# Patient Record
Sex: Female | Born: 1955 | Race: Black or African American | Hispanic: No | Marital: Married | Smoking: Never smoker
Health system: Southern US, Community
[De-identification: ages and names within clinical notes are randomized; demographics above are authoritative.]

## PROBLEM LIST (undated history)

## (undated) DIAGNOSIS — M5116 Intervertebral disc disorders with radiculopathy, lumbar region: Secondary | ICD-10-CM

## (undated) DIAGNOSIS — E119 Type 2 diabetes mellitus without complications: Secondary | ICD-10-CM

## (undated) DIAGNOSIS — I2510 Atherosclerotic heart disease of native coronary artery without angina pectoris: Secondary | ICD-10-CM

## (undated) DIAGNOSIS — H35033 Hypertensive retinopathy, bilateral: Secondary | ICD-10-CM

## (undated) DIAGNOSIS — E785 Hyperlipidemia, unspecified: Secondary | ICD-10-CM

## (undated) DIAGNOSIS — E538 Deficiency of other specified B group vitamins: Secondary | ICD-10-CM

## (undated) DIAGNOSIS — M1990 Unspecified osteoarthritis, unspecified site: Secondary | ICD-10-CM

## (undated) DIAGNOSIS — H40053 Ocular hypertension, bilateral: Secondary | ICD-10-CM

## (undated) DIAGNOSIS — R1011 Right upper quadrant pain: Secondary | ICD-10-CM

## (undated) HISTORY — DX: Unspecified osteoarthritis, unspecified site: M19.90

## (undated) HISTORY — DX: Type 2 diabetes mellitus without complications: E11.9

---

## 1999-02-17 ENCOUNTER — Other Ambulatory Visit: Admission: RE | Admit: 1999-02-17 | Discharge: 1999-02-17 | Payer: Self-pay | Admitting: Obstetrics and Gynecology

## 2001-05-05 ENCOUNTER — Other Ambulatory Visit: Admission: RE | Admit: 2001-05-05 | Discharge: 2001-05-05 | Payer: Self-pay | Admitting: Obstetrics and Gynecology

## 2001-05-07 ENCOUNTER — Emergency Department (HOSPITAL_COMMUNITY): Admission: EM | Admit: 2001-05-07 | Discharge: 2001-05-07 | Payer: Self-pay | Admitting: *Deleted

## 2001-05-07 ENCOUNTER — Encounter: Payer: Self-pay | Admitting: *Deleted

## 2003-02-26 ENCOUNTER — Ambulatory Visit (HOSPITAL_BASED_OUTPATIENT_CLINIC_OR_DEPARTMENT_OTHER): Admission: RE | Admit: 2003-02-26 | Discharge: 2003-02-26 | Payer: Self-pay | Admitting: Otolaryngology

## 2015-04-15 ENCOUNTER — Encounter: Payer: Self-pay | Admitting: Family Medicine

## 2015-04-15 ENCOUNTER — Ambulatory Visit (INDEPENDENT_AMBULATORY_CARE_PROVIDER_SITE_OTHER): Payer: BC Managed Care – PPO | Admitting: Family Medicine

## 2015-04-15 ENCOUNTER — Encounter (INDEPENDENT_AMBULATORY_CARE_PROVIDER_SITE_OTHER): Payer: Self-pay

## 2015-04-15 VITALS — BP 110/70 | HR 93 | Temp 98.4°F | Ht 63.86 in | Wt 231.0 lb

## 2015-04-15 DIAGNOSIS — Z800 Family history of malignant neoplasm of digestive organs: Secondary | ICD-10-CM

## 2015-04-15 DIAGNOSIS — E669 Obesity, unspecified: Secondary | ICD-10-CM

## 2015-04-15 DIAGNOSIS — M179 Osteoarthritis of knee, unspecified: Secondary | ICD-10-CM | POA: Diagnosis not present

## 2015-04-15 DIAGNOSIS — M1711 Unilateral primary osteoarthritis, right knee: Secondary | ICD-10-CM

## 2015-04-15 NOTE — Patient Instructions (Signed)
Nice to meet you. Please decrease your soda and tea intake to 2 drinks per week as discussed.  [facility]  We will refer you for a colonoscopy.  Please drop the x-ray of your knee off at the office for me to review.  If you have knee pain, back pain, numbness, weakness, fevers, abdominal pain, blood in your stool, or worsening knee pain please seek medical attention.

## 2015-04-15 NOTE — Progress Notes (Signed)
Pre visit review using our clinic review tool, if applicable. No additional management support is needed unless otherwise documented below in the visit note. 

## 2015-04-16 ENCOUNTER — Encounter: Payer: Self-pay | Admitting: Family Medicine

## 2015-04-16 DIAGNOSIS — E669 Obesity, unspecified: Principal | ICD-10-CM

## 2015-04-16 DIAGNOSIS — M1711 Unilateral primary osteoarthritis, right knee: Secondary | ICD-10-CM | POA: Insufficient documentation

## 2015-04-16 DIAGNOSIS — Z800 Family history of malignant neoplasm of digestive organs: Secondary | ICD-10-CM | POA: Insufficient documentation

## 2015-04-16 NOTE — Assessment & Plan Note (Signed)
Discussed diet and exercise. Patient with good exercise regimen. Poor diet. Patient states she will attempt to lose 8 lbs in the next month prior to next visit. She will do this by decreasing soda intake to 2 sodas/week. She will exercise 4 days a week. She will incorporate more vegetables with her meals. F/u in one month.

## 2015-04-16 NOTE — Assessment & Plan Note (Signed)
History of this. Knee intermittently bothers her. She had recent XRs and will bring the CD by the office. Neurovascularly intact. No abnormalities on exam. Discussed that weight loss would be the most benefit. Sounds like she also had sciatica vs piriformis syndrome. This is improved and patient is asymptomatic at this time. No red flags. Will continue to monitor. Will continue naproxen prn. Given return precautions.

## 2015-04-16 NOTE — Progress Notes (Signed)
Patient ID: Margaret Stark, female   DOB: [birth year], 59 y.o.   MRN: [id number]  [name], MD Phone: [phone]  Margaret Stark is a 59 y.o. female who presents today for new patient visit.   Obesity: patient comes in today for discussion of weight loss. Notes she has a friend that has been on phentermine and looks great from taking this. She goes to water aerobics 3x/wk. Walks some with this. Diet consists of lots of sodas and sweet tea. She stress eats when her job becomes stressful. Eats a fair number of cookies and gelato on the weekends. Typically has eggs and bacon for breakfast, lean cuisine at lunch, and salad with lots of dressing for dinner. Does not eat many veggies or fruits. Snacks during the afternoon from the vending machine. Notes she typically does better with accountability and has lost 40 lbs when trying in the past.   Right knee OA: notes long history of right knee pain. Notes she has recently been evaluated for this and that she had some spasms in her right buttock radiating down her right posterior leg and right foot numbness. She has no back pain. No popping, locking or catching in her knee. Knee only hurts when she walks. Notes she has been taking flexeril and naproxen for her buttock spasm and has had no recurrent issues with this. She denies numbness and weakness at this time.   Family history of colon cancer: sister with colon cancer at age 60. Patient reports prior colonoscopies have been normal. Has not had abdominal pain or blood in stool. No colonoscopy in >10 years. Is due for colonoscopy.   Active Ambulatory Problems    Diagnosis Date Noted  . Obesity 04/16/2015  . Osteoarthritis of right knee 04/16/2015  . Family history of colon cancer 04/16/2015   Resolved Ambulatory Problems    Diagnosis Date Noted  . No Resolved Ambulatory Problems   Past Medical History  Diagnosis Date  . Arthritis     Family History  Problem Relation Age of Onset  .  Hypertension Mother   . Hypertension Father   . Cancer Sister     Social History   Social History  . Marital Status: Married    Spouse Name: N/A  . Number of Children: N/A  . Years of Education: N/A   Occupational History  . Not on file.   Social History Main Topics  . Smoking status: Never Smoker   . Smokeless tobacco: Not on file  . Alcohol Use: 0.0 oz/week    0 Standard drinks or equivalent per week  . Drug Use: No  . Sexual Activity: Not on file   Other Topics Concern  . Not on file   Social History Narrative  . No narrative on file    ROS   General:  Negative for nexplained weight loss, fever Skin: Negative for new or changing mole, sore that won't heal HEENT: Negative for trouble hearing, trouble seeing, ringing in ears, mouth sores, hoarseness, change in voice, dysphagia. CV:  Negative for chest pain, dyspnea, edema, palpitations Resp: Negative for cough, dyspnea, hemoptysis GI: Negative for nausea, vomiting, diarrhea, constipation, abdominal pain, melena, hematochezia. GU: Negative for dysuria, incontinence, urinary hesitance, hematuria, vaginal or penile discharge, polyuria, sexual difficulty, lumps in testicle or breasts MSK: Positive for joint pain, Negative for muscle cramps or aches, joint swelling Neuro: Negative for headaches, weakness, numbness, dizziness, passing out/fainting Psych: Negative for depression, anxiety, memory problems  Objective  Physical Exam Filed  Vitals:   04/15/15 1611  BP: 110/70  Pulse: 93  Temp: 98.4 F (36.9 C)    BP Readings from Last 3 Encounters:  04/15/15 110/70   Wt Readings from Last 3 Encounters:  04/15/15 231 lb (104.781 kg)    Physical Exam  Constitutional: She is well-developed, well-nourished, and in no distress.  HENT:  Head: Normocephalic and atraumatic.  Right Ear: External ear normal.  Left Ear: External ear normal.  Mouth/Throat: Oropharynx is clear and moist. No oropharyngeal exudate.  Eyes:  Conjunctivae are normal. Pupils are equal, round, and reactive to light.  Neck: Neck supple.  Cardiovascular: Normal rate, regular rhythm and normal heart sounds.  Exam reveals no gallop and no friction rub.   No murmur heard. Pulmonary/Chest: Effort normal and breath sounds normal. No respiratory distress. She has no wheezes. She has no rales.  Abdominal: Soft. Bowel sounds are normal. She exhibits no distension. There is no tenderness. There is no rebound and no guarding.  Musculoskeletal:  Bilateral knees with no swelling, erythema, tenderness, ligamentous laxity, and has negative mcmurray, feet WWP No back or buttock tenderness, no midline spine tenderness, negative straight leg raise  Lymphadenopathy:    She has no cervical adenopathy.  Neurological: She is alert.  5/5 strength in bilateral quads, hamstrings, plantar and dorsiflexion, sensation to light touch intact in bilateral LE, normal gait, 2+ patellar reflexes  Skin: Skin is warm and dry. She is not diaphoretic.  Psychiatric: Mood and affect normal.     Assessment/Plan:   Obesity Discussed diet and exercise. Patient with good exercise regimen. Poor diet. Patient states she will attempt to lose 8 lbs in the next month prior to next visit. She will do this by decreasing soda intake to 2 sodas/week. She will exercise 4 days a week. She will incorporate more vegetables with her meals. F/u in one month.   Osteoarthritis of right knee History of this. Knee intermittently bothers her. She had recent XRs and will bring the CD by the office. Neurovascularly intact. No abnormalities on exam. Discussed that weight loss would be the most benefit. Sounds like she also had sciatica vs piriformis syndrome. This is improved and patient is asymptomatic at this time. No red flags. Will continue to monitor. Will continue naproxen prn. Given return precautions.   Family history of colon cancer Sister with colon cancer at age 46. Has not had  colonoscopy in >10 years. No abdominal pain or blood in stool. Will refer to GI for further evaluation.     [name]

## 2015-04-16 NOTE — Assessment & Plan Note (Signed)
Sister with colon cancer at age 58. Has not had colonoscopy in >10 years. No abdominal pain or blood in stool. Will refer to GI for further evaluation.

## 2015-05-06 ENCOUNTER — Telehealth: Payer: Self-pay | Admitting: Family Medicine

## 2015-05-06 NOTE — Telephone Encounter (Signed)
Patient dropped off disc . Disc in [facility] box.

## 2015-05-06 NOTE — Telephone Encounter (Signed)
Please advise 

## 2015-05-15 ENCOUNTER — Ambulatory Visit (INDEPENDENT_AMBULATORY_CARE_PROVIDER_SITE_OTHER): Payer: BC Managed Care – PPO | Admitting: Family Medicine

## 2015-05-15 ENCOUNTER — Other Ambulatory Visit: Payer: Self-pay | Admitting: Family Medicine

## 2015-05-15 ENCOUNTER — Encounter: Payer: Self-pay | Admitting: Family Medicine

## 2015-05-15 VITALS — BP 118/68 | HR 90 | Temp 98.8°F | Ht 63.86 in | Wt 233.6 lb

## 2015-05-15 DIAGNOSIS — M1711 Unilateral primary osteoarthritis, right knee: Secondary | ICD-10-CM

## 2015-05-15 DIAGNOSIS — E669 Obesity, unspecified: Secondary | ICD-10-CM

## 2015-05-15 DIAGNOSIS — F439 Reaction to severe stress, unspecified: Secondary | ICD-10-CM

## 2015-05-15 DIAGNOSIS — Z638 Other specified problems related to primary support group: Secondary | ICD-10-CM | POA: Diagnosis not present

## 2015-05-15 DIAGNOSIS — M179 Osteoarthritis of knee, unspecified: Secondary | ICD-10-CM | POA: Diagnosis not present

## 2015-05-15 DIAGNOSIS — Z1211 Encounter for screening for malignant neoplasm of colon: Secondary | ICD-10-CM

## 2015-05-15 NOTE — Progress Notes (Signed)
Pre visit review using our clinic review tool, if applicable. No additional management support is needed unless otherwise documented below in the visit note. 

## 2015-05-15 NOTE — Progress Notes (Signed)
Patient ID: Margaret Stark, female   DOB: January 13, 1956, 59 y.o.   MRN: [id number]  Margaret Rumps, MD Phone: [phone]  Margaret Stark is a 59 y.o. female who presents today for f/u.  Obesity: patient presents for follow-up of weight loss. She notes she has not lost any weight and in fact gained several pounds. She notes she has continued to drink soda and eat unhealthy foods as it is her way of dealing with stress and depression. She notes she has continued to do water aerobics and enjoys this, though has been unable to cut down on soda or junk food.  When asked about the stressors, she notes she has had recent stressors at work and at home. She denies depression, though does feel down on her self related to these stressors. She denies SI. When asked if she has had thoughts of harming anyone else, she notes no thoughts of physically harming or killing anyone. She then noted there is a Chief Strategy Officer that is working on her house that "better watch his back if he doesn't finish my house soon." When asked what this meant, she stated the same phrase again. I asked if this meant physical harm or homicide and she stated that she would not physically harm or kill any one and would not physically harm or kill the contractor, then she stated that she was planning on getting [employer] to look in to the contractor or sending a letter to the news paper if he did not finish the work on her house soon. She notes the work was started in July and should be done by now. She notes her husband paid the contractor in full already and that she tried to let her husband manage the household, though this has not worked to this point and that this has caused stress in her marriage and caused her to tell her husband that she was leaving him, though they are now working through there issues. She notes she is going to give the contractor 2 weeks to finish the work, then she is going to speak to him regarding this to make sure this  work gets completed quickly. She notes she will ask him to refund $500 a week until the work is done if the house is not finished in the next 2 weeks. She denies any intent or plan to harm the contractor in a physical manner. She denies thoughts of homicidal ideation. She noted on several occasions during the encounter that she would not harm anyone. She notes she has previously seen a counselor, though has not seen them in some time. She notes she is not interested medication for this stress. She is not interested in seeing the counselor at this time.   Right knee pain: patient notes this is stable. CD with XR of right knee reviewed. Patient appears to have arthritic changes. Pain is well controlled with OTC NSAIDs. No pain at this time. Patient wants to know if seeing [facility] would be beneficial.   PMH: nonsmoker.   ROS see HPI  Objective  Physical Exam Filed Vitals:   05/15/15 1558  BP: 118/68  Pulse: 90  Temp: 98.8 F (37.1 C)   Physical Exam  Constitutional: She is well-developed, well-nourished, and in no distress.  HENT:  Head: Normocephalic and atraumatic.  Cardiovascular: Normal rate, regular rhythm and normal heart sounds.  Exam reveals no gallop and no friction rub.   No murmur heard. Pulmonary/Chest: Effort normal and breath sounds normal. No respiratory distress.  She has no wheezes. She has no rales.  Musculoskeletal:  Bilateral knees with no swelling, erythema, tenderness, ligamentous laxity, and has negative mcmurray, feet WWP  Neurological: She is alert. Gait normal.  Skin: Skin is warm and dry. She is not diaphoretic.  Psychiatric:  Normal affect and normal mood     Assessment/Plan: Please see individual problem list.  Obesity Has not made any progress. Symptoms be mostly related to recent stressors in life. She is going to make an effort to cut sodas out and eat healthier. She is going to continue to do water aerobics. We will continue to monitor this and  see her back in one month.  Osteoarthritis of right knee Patient with continued intermittent right knee discomfort. She has OA based on x-ray review. Advised that she can continue over-the-counter NSAIDs for this. She could consider steroid injection in the future. Briefly looked up [facility] online and noted that this is a group of doctors that appears to do viscous supplementation and other proprietary in office procedures to the knee. It appears that the closest office is in [place] and that the physician that works there was previously with an orthopedic group. Advised patient that they appear to be MDs per review of their website and if she wanted to look into doing this she could do this on her own.  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 She has no thoughts of harming herself. She has no thoughts of physically harming or killing anybody. She has no intent or plan to harm anyone. She does note the main stressor is a Chief Strategy Officer at her home and that she is going to get a news team to look into the contractor if he is unable to finish the work on her house. I offered support to the patient. Discussed counseling and/or medication for this. She declined both of these. I advised her that if she were to develop thoughts of physically harming or killing anyone or if she developed intent or a plan to harm anyone or develop thoughts of harming herself she needed to seek medical attention immediately. She was given return precautions.    Margaret Stark

## 2015-05-15 NOTE — Patient Instructions (Addendum)
Nice to see you. Please continue to work on the diet changes we discussed at the last visit.  Please continue to exercise by doing water aerobics.  If you would like help with your stress please let us know.  If you feel that you are going to harm yourself or anyone else please seek medical attention immediately.

## 2015-05-15 NOTE — Telephone Encounter (Signed)
Disc reviewed. Will discuss with patient this afternoon during her office visit.

## 2015-05-16 DIAGNOSIS — F439 Reaction to severe stress, unspecified: Secondary | ICD-10-CM | POA: Insufficient documentation

## 2015-05-16 NOTE — Assessment & Plan Note (Addendum)
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 She has no thoughts of harming herself. She has no thoughts of physically harming or killing anybody. She has no intent or plan to harm anyone. She does note the main stressor is a Chief Strategy Officer at her home and that she is going to get a news team to look into the contractor if he is unable to finish the work on her house. I offered support to the patient. Discussed counseling and/or medication for this. She declined both of these. I advised her that if she were to develop thoughts of physically harming or killing anyone or if she developed intent or a plan to harm anyone or develop thoughts of harming herself she needed to seek medical attention immediately. She was given return precautions.

## 2015-05-16 NOTE — Assessment & Plan Note (Addendum)
Patient with continued intermittent right knee discomfort. She has OA based on x-ray review. Advised that she can continue over-the-counter NSAIDs for this. She could consider steroid injection in the future. Briefly looked up [facility] online and noted that this is a group of doctors that appears to do viscous supplementation and other proprietary in office procedures to the knee. It appears that the closest office is in [place] and that the physician that works there was previously with an orthopedic group. Advised patient that they appear to be MDs per review of their website and if she wanted to look into doing this she could do this on her own.

## 2015-05-16 NOTE — Assessment & Plan Note (Signed)
Has not made any progress. Symptoms be mostly related to recent stressors in life. She is going to make an effort to cut sodas out and eat healthier. She is going to continue to do water aerobics. We will continue to monitor this and see her back in one month.

## 2015-05-20 ENCOUNTER — Encounter (INDEPENDENT_AMBULATORY_CARE_PROVIDER_SITE_OTHER): Payer: Self-pay

## 2015-06-19 ENCOUNTER — Encounter: Payer: Self-pay | Admitting: Family Medicine

## 2015-06-19 ENCOUNTER — Ambulatory Visit (INDEPENDENT_AMBULATORY_CARE_PROVIDER_SITE_OTHER): Payer: BC Managed Care – PPO | Admitting: Family Medicine

## 2015-06-19 ENCOUNTER — Ambulatory Visit: Payer: BC Managed Care – PPO | Admitting: Family Medicine

## 2015-06-19 VITALS — BP 116/72 | HR 81 | Temp 98.1°F | Ht 63.86 in | Wt 237.0 lb

## 2015-06-19 DIAGNOSIS — M1711 Unilateral primary osteoarthritis, right knee: Secondary | ICD-10-CM

## 2015-06-19 DIAGNOSIS — M179 Osteoarthritis of knee, unspecified: Secondary | ICD-10-CM

## 2015-06-19 DIAGNOSIS — Z23 Encounter for immunization: Secondary | ICD-10-CM

## 2015-06-19 DIAGNOSIS — E669 Obesity, unspecified: Secondary | ICD-10-CM

## 2015-06-19 DIAGNOSIS — M7918 Myalgia, other site: Secondary | ICD-10-CM | POA: Insufficient documentation

## 2015-06-19 DIAGNOSIS — F439 Reaction to severe stress, unspecified: Secondary | ICD-10-CM

## 2015-06-19 DIAGNOSIS — M791 Myalgia: Secondary | ICD-10-CM | POA: Diagnosis not present

## 2015-06-19 DIAGNOSIS — Z638 Other specified problems related to primary support group: Secondary | ICD-10-CM

## 2015-06-19 MED ORDER — CYCLOBENZAPRINE HCL 10 MG PO TABS: 10.0000 mg | ORAL_TABLET | Freq: Three times a day (TID) | ORAL | Status: DC | PRN

## 2015-06-19 NOTE — Assessment & Plan Note (Signed)
Right buttock with intermittent cramping sensation radiating down the back of her leg. She has a benign exam today in her back and buttock. Negative straight leg raise. She's neurologically intact. Discussed that this could be coming from her back or muscle spasm in her buttock. We will treat with Flexeril to see if this is beneficial. She can continue as needed naproxen. She is given return precautions.

## 2015-06-19 NOTE — Assessment & Plan Note (Signed)
Injection per below. Given return precautions.

## 2015-06-19 NOTE — Progress Notes (Signed)
Pre visit review using our clinic review tool, if applicable. No additional management support is needed unless otherwise documented below in the visit note. 

## 2015-06-19 NOTE — Assessment & Plan Note (Signed)
Needs to work on diet and exercise. Reinforced this.  losing weight will help with her knee pain.

## 2015-06-19 NOTE — Assessment & Plan Note (Signed)
Stress is improved at home, though still with some stress at work. She denies depression and anxiety. No thoughts of harming herself or others. She would like to continue to monitor this. She'll let us know if this worsens. She's given return precautions.

## 2015-06-19 NOTE — Patient Instructions (Signed)
Nice to see you.  We did an injection in her right knee today. If this becomes  Swollen, red, warm, or you develop fevers please seek medical attention immediately. Continue to work on diet and exercise.  Monitor your stress levels and if this worsens please follow-up.

## 2015-06-19 NOTE — Progress Notes (Signed)
Patient ID: Margaret Stark, female   DOB: 06-14-1956, 59 y.o.   MRN: [id number]  [name], MD Phone: [phone]  [name] is a 59 y.o. female who presents today for  Follow-up.  Right knee osteoarthritis: patient notes continued discomfort in her anterior right knee. She notes it hurts when she goes from seated to standing position. It does occasionally feel like it's going to lock up, though does not. She's not had any knee swelling or erythema. She's not had any fevers. She has had x-rays recently that revealed right knee osteoarthritis. She notes in the past this has responded well to steroid injection and weight loss.  Patient also notes some right buttock spasms that have been going on intermittently for some time. She notes this radiates down the back of her leg. It is a slow radiation. She has no back pain with this. She has no leg swelling with this. She has no numbness or weakness in her legs. She denies saddle anesthesia, bowel and bladder incontinence, fevers, and history of cancer.  Patient has not made much progress with diet. Does not change from last visit. She's not been exercising either. She notes she is still stressed out at work, though she let the stressors at home go and is not worried about them anymore. She notes her house is almost finished. She is not depressed or anxious. She denies SI and HI.  PMH: nonsmoker.   ROS see history of present illness  Objective  Physical Exam Filed Vitals:   06/19/15 1017  BP: 116/72  Pulse: 81  Temp: 98.1 F (36.7 C)    BP Readings from Last 3 Encounters:  06/19/15 116/72  05/15/15 118/68  04/15/15 110/70   Wt Readings from Last 3 Encounters:  06/19/15 237 lb (107.502 kg)  05/15/15 233 lb 9.6 oz (105.96 kg)  04/15/15 231 lb (104.781 kg)    Physical Exam  Constitutional: She is well-developed, well-nourished, and in no distress.  HENT:  Head: Normocephalic and atraumatic.  Cardiovascular: Normal  rate, regular rhythm and normal heart sounds.  Exam reveals no gallop and no friction rub.   No murmur heard. Pulmonary/Chest: Effort normal and breath sounds normal. No respiratory distress. She has no wheezes. She has no rales.  Musculoskeletal:   Right knee with no tenderness palpation, effusion, erythema, or warmth, there is no joint line tenderness, there is no tenderness of the patellar tendon, there is discomfort on extension note the knee , negative McMurray's, no ligamentous laxity   left knee with no tenderness palpation, effusion, erythema, or warmth there is no joint line tenderness, there is no tenderness in the patellar region , there is no ligamentous laxity, negative McMurray's No midline spine tenderness, no muscular back tenderness, no buttocks tenderness, no swelling in these areas, negative straight leg raise  Neurological: She is alert.  5 out of 5 strength in bilateral quads, hamstrings, plantar flexion, and dorsiflexion, sensation to light touch intact in bilateral lower extremities, 2+ patellar reflexes  Skin: Skin is warm and dry. She is not diaphoretic.     Assessment/Plan: Please see individual problem list.  Osteoarthritis of right knee Injection per below. Given return precautions.  Obesity Needs to work on diet and exercise. Reinforced this.  losing weight will help with her knee pain.  Stress at home Stress is improved at home, though still with some stress at work. She denies depression and anxiety. No thoughts of harming herself or others. She would like to continue  to monitor this. She'll let us know if this worsens. She's given return precautions.  Buttock pain Right buttock with intermittent cramping sensation radiating down the back of her leg. She has a benign exam today in her back and buttock. Negative straight leg raise. She's neurologically intact. Discussed that this could be coming from her back or muscle spasm in her buttock. We will treat with  Flexeril to see if this is beneficial. She can continue as needed naproxen. She is given return precautions.    Orders Placed This Encounter  Procedures  . Flu Vaccine QUAD 36+ mos IM    Meds ordered this encounter  Medications  . cyclobenzaprine (FLEXERIL) 10 MG tablet    Sig: Take 1 tablet (10 mg total) by mouth 3 (three) times daily as needed for muscle spasms.    Dispense:  20 tablet    Refill:  0    Knee Injection Procedure Note  Pre-operative Diagnosis: right knee osteoarthritis  Post-operative Diagnosis: same  Indications: Symptom relief from osteoarthritis  Anesthesia:   Skin freeze spray  Procedure Details    written consent was obtained for the procedure. The joint was prepped with Betadine and skin freeze spray was used to anesthetize the skin. A 25 gauge needle was inserted into the medial aspect of the joint line. 3 ml 1% lidocaine and 1 ml of  Depo-Medrol 40 mg per 1 mL was then injected into the joint. The needle was removed and the area cleansed and dressed.  Complications:  None; patient tolerated the procedure well.  [name]

## 2015-06-24 ENCOUNTER — Other Ambulatory Visit: Payer: Self-pay | Admitting: Family Medicine

## 2015-06-24 DIAGNOSIS — Z13220 Encounter for screening for lipoid disorders: Secondary | ICD-10-CM

## 2015-06-24 DIAGNOSIS — E669 Obesity, unspecified: Secondary | ICD-10-CM

## 2015-07-15 ENCOUNTER — Encounter
Admission: RE | Disposition: A | Discharge status: Home / Self Care | Payer: Self-pay | Source: Ambulatory Visit | Attending: Gastroenterology

## 2015-07-15 ENCOUNTER — Encounter: Payer: Self-pay | Admitting: *Deleted

## 2015-07-15 ENCOUNTER — Ambulatory Visit
Admission: RE | Admit: 2015-07-15 | Discharge: 2015-07-15 | Disposition: A | Discharge status: Home / Self Care | Payer: BC Managed Care – PPO | Source: Ambulatory Visit | Attending: Gastroenterology | Admitting: Gastroenterology

## 2015-07-15 ENCOUNTER — Ambulatory Visit: Payer: BC Managed Care – PPO | Admitting: Anesthesiology

## 2015-07-15 ENCOUNTER — Encounter: Payer: Self-pay | Admitting: Family Medicine

## 2015-07-15 ENCOUNTER — Ambulatory Visit (INDEPENDENT_AMBULATORY_CARE_PROVIDER_SITE_OTHER): Payer: BC Managed Care – PPO | Admitting: Family Medicine

## 2015-07-15 DIAGNOSIS — M179 Osteoarthritis of knee, unspecified: Secondary | ICD-10-CM | POA: Insufficient documentation

## 2015-07-15 DIAGNOSIS — Z6841 Body Mass Index (BMI) 40.0 and over, adult: Secondary | ICD-10-CM | POA: Insufficient documentation

## 2015-07-15 DIAGNOSIS — Z8249 Family history of ischemic heart disease and other diseases of the circulatory system: Secondary | ICD-10-CM | POA: Insufficient documentation

## 2015-07-15 DIAGNOSIS — R072 Precordial pain: Secondary | ICD-10-CM

## 2015-07-15 DIAGNOSIS — Z1211 Encounter for screening for malignant neoplasm of colon: Secondary | ICD-10-CM | POA: Diagnosis present

## 2015-07-15 DIAGNOSIS — Z800 Family history of malignant neoplasm of digestive organs: Secondary | ICD-10-CM | POA: Diagnosis not present

## 2015-07-15 DIAGNOSIS — D122 Benign neoplasm of ascending colon: Secondary | ICD-10-CM | POA: Insufficient documentation

## 2015-07-15 DIAGNOSIS — M1990 Unspecified osteoarthritis, unspecified site: Secondary | ICD-10-CM | POA: Diagnosis not present

## 2015-07-15 HISTORY — PX: COLONOSCOPY WITH PROPOFOL: SHX5780

## 2015-07-15 SURGERY — COLONOSCOPY WITH PROPOFOL
Anesthesia: General

## 2015-07-15 MED ORDER — SODIUM CHLORIDE 0.9 % IV SOLN: INTRAVENOUS | Status: DC

## 2015-07-15 MED ORDER — SODIUM CHLORIDE 0.9 % IV SOLN
INTRAVENOUS | Status: DC
  Administered 2015-07-15: 1000 mL via INTRAVENOUS

## 2015-07-15 MED ORDER — PROPOFOL 10 MG/ML IV BOLUS
INTRAVENOUS | Status: DC | PRN
  Administered 2015-07-15: 20 mg via INTRAVENOUS
  Administered 2015-07-15: 80 mg via INTRAVENOUS

## 2015-07-15 NOTE — H&P (Signed)
    Primary Care Physician:  [name], MD Primary Gastroenterologist:  Dr. Candace Cruise  Pre-Procedure History & Physical: HPI:  Margaret Stark is a 59 y.o. female is here for an colonoscopy.   Past Medical History  Diagnosis Date  . Arthritis     History reviewed. No pertinent past surgical history.  Prior to Admission medications   Medication Sig Start Date End Date Taking? Authorizing Provider  cyclobenzaprine (FLEXERIL) 10 MG tablet Take 1 tablet (10 mg total) by mouth 3 (three) times daily as needed for muscle spasms. 06/19/15   Leone Haven, MD  LUMIGAN 0.01 % SOLN INSTILL 1 DROP IN [facility] EYE AT BEDTIME 03/03/15   Historical Provider, MD  naproxen (NAPROSYN) 500 MG tablet Take 500 mg by mouth 2 (two) times daily with a meal. 03/08/15   Historical Provider, MD    Allergies as of 07/11/2015  . (No Known Allergies)    Family History  Problem Relation Age of Onset  . Hypertension Mother   . Hypertension Father   . Cancer Sister     colon - age 61    Social History   Social History  . Marital Status: Married    Spouse Name: N/A  . Number of Children: N/A  . Years of Education: N/A   Occupational History  . Not on file.   Social History Main Topics  . Smoking status: Never Smoker   . Smokeless tobacco: Not on file  . Alcohol Use: 0.0 oz/week    0 Standard drinks or equivalent per week  . Drug Use: No  . Sexual Activity: Not on file   Other Topics Concern  . Not on file   Social History Narrative    Review of Systems: See HPI, otherwise negative ROS  Physical Exam: BP 122/79 mmHg  Pulse 89  Temp(Src) 97.2 F (36.2 C) (Tympanic)  Resp 20  Ht 5\' 4"  (1.626 m)  Wt 106.595 kg (235 lb)  BMI 40.32 kg/m2  SpO2 100% General:   Alert,  pleasant and cooperative in NAD Head:  Normocephalic and atraumatic. Neck:  Supple; no masses or thyromegaly. Lungs:  Clear throughout to auscultation.    Heart:  Regular rate and rhythm. Abdomen:  Soft, nontender and  nondistended. Normal bowel sounds, without guarding, and without rebound.   Neurologic:  Alert and  oriented x4;  grossly normal neurologically.  Impression/Plan: Margaret Stark is here for an colonoscopy to be performed for screening, family hx of colon cancer Risks, benefits, limitations, and alternatives regarding  colonoscopy have been reviewed with the patient.  Questions have been answered.  All parties agreeable.   Shonika Kolasinski, Lupita Dawn, MD  07/15/2015, 2:59 PM

## 2015-07-15 NOTE — Anesthesia Preprocedure Evaluation (Signed)
Anesthesia Evaluation  Patient identified by MRN, date of birth, ID band Patient awake    Reviewed: Allergy & Precautions, H&P , NPO status , Patient's Chart, lab work & pertinent test results  History of Anesthesia Complications Negative for: history of anesthetic complications  Airway Mallampati: III  TM Distance: >3 FB Neck ROM: full    Dental no notable dental hx. (+) Teeth Intact   Pulmonary neg shortness of breath, sleep apnea ,    Pulmonary exam normal breath sounds clear to auscultation       Cardiovascular Exercise Tolerance: Good (-) angina(-) Past MI and (-) DOE negative cardio ROS Normal cardiovascular exam Rhythm:regular Rate:Normal     Neuro/Psych negative neurological ROS  negative psych ROS   GI/Hepatic negative GI ROS, Neg liver ROS, neg GERD  ,  Endo/Other  Morbid obesity  Renal/GU negative Renal ROS  negative genitourinary   Musculoskeletal   Abdominal   Peds  Hematology negative hematology ROS (+)   Anesthesia Other Findings Past Medical History:   Arthritis                                                   History reviewed. No pertinent surgical history.  BMI    Body Mass Index   40.31 kg/m 2      Reproductive/Obstetrics negative OB ROS                             Anesthesia Physical Anesthesia Plan  ASA: III  Anesthesia Plan: General   Post-op Pain Management:    Induction:   Airway Management Planned:   Additional Equipment:   Intra-op Plan:   Post-operative Plan:   Informed Consent: I have reviewed the patients History and Physical, chart, labs and discussed the procedure including the risks, benefits and alternatives for the proposed anesthesia with the patient or authorized representative who has indicated his/her understanding and acceptance.   Dental Advisory Given  Plan Discussed with: Anesthesiologist, CRNA and Surgeon  Anesthesia  Plan Comments:         Anesthesia Quick Evaluation

## 2015-07-15 NOTE — Progress Notes (Signed)
Pre visit review using our clinic review tool, if applicable. No additional management support is needed unless otherwise documented below in the visit note. 
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
## 2015-07-15 NOTE — Transfer of Care (Signed)
Immediate Anesthesia Transfer of Care Note  Patient: Margaret Stark  Procedure(s) Performed: Procedure(s): COLONOSCOPY WITH PROPOFOL (N/A)  Patient Location: Endoscopy Unit  Anesthesia Type:General  Level of Consciousness: awake and alert   Airway & Oxygen Therapy: Patient Spontanous Breathing and Patient connected to nasal cannula oxygen  Post-op Assessment: Report given to RN and Post -op Vital signs reviewed and stable  Post vital signs: Reviewed and stable  Last Vitals:  Filed Vitals:   07/15/15 1554 07/15/15 1600  BP: 111/71 111/69  Pulse: 80 82  Temp:    Resp: 14 14    Complications: No apparent anesthesia complications and Patient re-intubated

## 2015-07-15 NOTE — Anesthesia Postprocedure Evaluation (Signed)
Anesthesia Post Note  Patient: Margaret Stark  Procedure(s) Performed: Procedure(s) (LRB): COLONOSCOPY WITH PROPOFOL (N/A)  Patient location during evaluation: Endoscopy Anesthesia Type: General Level of consciousness: awake and alert Pain management: pain level controlled Vital Signs Assessment: post-procedure vital signs reviewed and stable Respiratory status: spontaneous breathing, nonlabored ventilation, respiratory function stable and patient connected to nasal cannula oxygen Cardiovascular status: blood pressure returned to baseline and stable Postop Assessment: no signs of nausea or vomiting Anesthetic complications: no    Last Vitals:  Filed Vitals:   07/15/15 1554 07/15/15 1600  BP: 111/71 111/69  Pulse: 80 82  Temp:    Resp: 14 14    Last Pain: There were no vitals filed for this visit.               Precious Haws Piscitello

## 2015-07-15 NOTE — Op Note (Signed)
[facility] Patient Name: Margaret Stark Procedure Date: 07/15/2015 3:06 PM MRN: [id number] Account #: [id number] Date of Birth: 1956/01/30 Admit Type: Outpatient Age: 59 Room: [facility] ENDO ROOM 4 Gender: Female Note Status: Finalized Procedure:         Colonoscopy Indications:       Screening in patient at increased risk: Family history of                     1st-degree relative with colorectal cancer before age 23                     years Providers:         Lupita Dawn. Candace Cruise, MD Referring MD:      Forest Gleason Md, MD (Referring MD) Medicines:         Monitored Anesthesia Care Complications:     No immediate complications. Procedure:         Pre-Anesthesia Assessment:                    - Prior to the procedure, a History and Physical was                     performed, and patient medications, allergies and                     sensitivities were reviewed. The patient's tolerance of                     previous anesthesia was reviewed.                    - The risks and benefits of the procedure and the sedation                     options and risks were discussed with the patient. All                     questions were answered and informed consent was obtained.                    - After reviewing the risks and benefits, the patient was                     deemed in satisfactory condition to undergo the procedure.                    After obtaining informed consent, the colonoscope was                     passed under direct vision. Throughout the procedure, the                     patient's blood pressure, pulse, and oxygen saturations                     were monitored continuously. The Colonoscope was                     introduced through the anus and advanced to the the cecum,                     identified by appendiceal orifice and ileocecal valve. The  colonoscopy was performed without difficulty. The patient    tolerated the procedure well. The quality of the bowel                     preparation was good. Findings:      A medium polyp was found in the ascending colon. The polyp was sessile.       The polyp was removed with a hot snare. Resection was complete, but the       polyp tissue was not retrieved.      The exam was otherwise without abnormality. Impression:        - One medium polyp in the ascending colon. Complete                     resection. Polyp tissue not retrieved.                    - The examination was otherwise normal. Recommendation:    - Discharge patient to home.                    - Repeat colonoscopy in 3 years for surveillance.                    - The findings and recommendations were discussed with the                     patient. Procedure Code(s): --- Professional ---                    [id number], Colonoscopy, flexible; with removal of tumor(s),                     polyp(s), or other lesion(s) by snare technique Diagnosis Code(s): --- Professional ---                    Z80.0, Family history of malignant neoplasm of digestive                     organs                    D12.2, Benign neoplasm of ascending colon CPT copyright 2014 American Medical Association. All rights reserved. The codes documented in this report are preliminary and upon coder review may  be revised to meet current compliance requirements. Hulen Luster, MD 07/15/2015 3:31:57 PM This report has been signed electronically. Number of Addenda: 0 Note Initiated On: 07/15/2015 3:06 PM Scope Withdrawal Time: 0 hours 12 minutes 53 seconds  Total Procedure Duration: 0 hours 16 minutes 47 seconds       [facility]
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
## 2015-07-15 NOTE — Anesthesia Postprocedure Evaluation (Deleted)
Anesthesia Post Note  Patient: Margaret Stark  Procedure(s) Performed: Procedure(s) (LRB): COLONOSCOPY WITH PROPOFOL (N/A)  Patient location during evaluation: Endoscopy Anesthesia Type: General Level of consciousness: awake and alert Pain management: pain level controlled Vital Signs Assessment: post-procedure vital signs reviewed and stable Respiratory status: spontaneous breathing, nonlabored ventilation, respiratory function stable and patient connected to nasal cannula oxygen Cardiovascular status: blood pressure returned to baseline and stable Postop Assessment: no signs of nausea or vomiting Anesthetic complications: no    Last Vitals:  Filed Vitals:   07/15/15 1554 07/15/15 1600  BP: 111/71 111/69  Pulse: 80 82  Temp:    Resp: 14 14    Last Pain: There were no vitals filed for this visit.               Precious Haws Piscitello
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
## 2015-07-18 ENCOUNTER — Encounter: Payer: Self-pay | Admitting: Gastroenterology

## 2016-01-23 ENCOUNTER — Encounter: Payer: Self-pay | Admitting: Family Medicine

## 2016-01-23 ENCOUNTER — Ambulatory Visit (INDEPENDENT_AMBULATORY_CARE_PROVIDER_SITE_OTHER): Payer: BC Managed Care – PPO | Admitting: Family Medicine

## 2016-01-23 VITALS — BP 120/76 | HR 87 | Temp 98.4°F | Ht 63.86 in | Wt 251.8 lb

## 2016-01-23 DIAGNOSIS — M791 Myalgia, unspecified site: Secondary | ICD-10-CM

## 2016-01-23 DIAGNOSIS — M179 Osteoarthritis of knee, unspecified: Secondary | ICD-10-CM | POA: Diagnosis not present

## 2016-01-23 DIAGNOSIS — E669 Obesity, unspecified: Secondary | ICD-10-CM | POA: Diagnosis not present

## 2016-01-23 DIAGNOSIS — M1711 Unilateral primary osteoarthritis, right knee: Secondary | ICD-10-CM

## 2016-01-23 MED ORDER — DICLOFENAC SODIUM 75 MG PO TBEC: 75.0000 mg | DELAYED_RELEASE_TABLET | Freq: Two times a day (BID) | ORAL | Status: DC

## 2016-01-23 NOTE — Assessment & Plan Note (Signed)
Patient with osteoarthritis in her right knee and suspect that is the cause of her left knee pain as well. Suspect arthritis could be causing her leg discomfort though could also be related to her recent increase in physical activity. Doubt other causes of myalgias though will obtain a CK to ensure it is not elevated. Diclofenac for her discomfort. Exercises given to patient. I do not think she would benefit from further injections in her knees with steroids given last one was not beneficial. Discussed next step as physical therapy or referral to sports medicine/orthopedics.

## 2016-01-23 NOTE — Addendum Note (Signed)
Addended by: Frutoso Chase A on: 01/23/2016 02:30 PM   Modules accepted: Orders

## 2016-01-23 NOTE — Assessment & Plan Note (Addendum)
Worsened chronic issue. Refer to nutrition. Advised to continue to exercise. Discussed that if she makes an effort over a several month period of time and does not have a decrease in weight could consider weight loss medications.

## 2016-01-23 NOTE — Progress Notes (Signed)
Patient ID: Margaret Stark, female   DOB: [birth year], 60 y.o.   MRN: [id number]  [name], MD Phone: [phone]  Margaret Stark is a 60 y.o. female who presents today for follow-up.  Knee pain: Patient notes bilateral knees have been hurting her. Ache and then occasional sharp pains in her right knee. Notes her muscles are sore as well. Notes she has been doing water aerobics and the muscles have only been sore since she started that. She is trying not to wear cheap shoes so that she has better support. Aleve has not been helpful. Using icy hot which has not been helpful. She had an injection in her right knee November of last year that was not beneficial.  Obesity: Patient notes she can't get herself to stop eating. She drinks too much soda and needs to be sweets. Breakfast is typically several hours after waking up and consist of a soda and then oatmeal pie. Is exercising by going to water aerobics 4 times a week.  PMH: nonsmoker.   ROS see history of present illness  Objective  Physical Exam Filed Vitals:   01/23/16 1349  BP: 120/76  Pulse: 87  Temp: 98.4 F (36.9 C)    BP Readings from Last 3 Encounters:  01/23/16 120/76  07/15/15 111/69  07/15/15 112/72   Wt Readings from Last 3 Encounters:  01/23/16 251 lb 12.8 oz (114.216 kg)  07/15/15 235 lb (106.595 kg)  07/15/15 235 lb 3.2 oz (106.686 kg)    Physical Exam  Constitutional: No distress.  HENT:  Head: Normocephalic and atraumatic.  Cardiovascular: Normal rate, regular rhythm and normal heart sounds.   Pulmonary/Chest: Effort normal and breath sounds normal.  Musculoskeletal:  Bilateral knees no swelling, bony tenderness, bony lesions, joint line tenderness, or ligamentous laxity, right knee with discomfort on McMurray's though no clicking, left knee negative McMurray's  Neurological: She is alert.  Skin: Skin is warm and dry. She is not diaphoretic.     Assessment/Plan: Please see individual  problem list.  Obesity Worsened chronic issue. Refer to nutrition. Advised to continue to exercise. Discussed that if she makes an effort over a several month period of time and does not have a decrease in weight could consider weight loss medications.  Osteoarthritis of right knee Patient with osteoarthritis in her right knee and suspect that is the cause of her left knee pain as well. Suspect arthritis could be causing her leg discomfort though could also be related to her recent increase in physical activity. Doubt other causes of myalgias though will obtain a CK to ensure it is not elevated. Diclofenac for her discomfort. Exercises given to patient. I do not think she would benefit from further injections in her knees with steroids given last one was not beneficial. Discussed next step as physical therapy or referral to sports medicine/orthopedics.    Orders Placed This Encounter  Procedures  . CK (Creatine Kinase)  . Amb ref to Medical Nutrition Therapy-MNT    Referral Priority:  Routine    Referral Type:  Consultation    Referral Reason:  Specialty Services Required    Requested Specialty:  Nutrition    Number of Visits Requested:  1    Meds ordered this encounter  Medications  . diclofenac (VOLTAREN) 75 MG EC tablet    Sig: Take 1 tablet (75 mg total) by mouth 2 (two) times daily.    Dispense:  30 tablet    Refill:  0   [name],  MD [place]

## 2016-01-23 NOTE — Patient Instructions (Signed)
Nice to see you. Please do the following exercises for her knees. I will refer you to nutrition. You can take diclofenac for your knees as well. Please continue exercising. Please start eating within an hour waking up in the morning. Please try to eat something healthier for breakfast such as eggs or fruit and yogurt.   Generic Knee Exercises EXERCISES RANGE OF MOTION (ROM) AND STRETCHING EXERCISES These exercises may help you when beginning to rehabilitate your injury. Your symptoms may resolve with or without further involvement from your physician, physical therapist, or athletic trainer. While completing these exercises, remember:   Restoring tissue flexibility helps normal motion to return to the joints. This allows healthier, less painful movement and activity.  An effective stretch should be held for at least 30 seconds.  A stretch should never be painful. You should only feel a gentle lengthening or release in the stretched tissue. STRETCH - Knee Extension, Prone  Lie on your stomach on a firm surface, such as a bed or countertop. Place your right / left knee and leg just beyond the edge of the surface. You may wish to place a towel under the far end of your right / left thigh for comfort.  Relax your leg muscles and allow gravity to straighten your knee. Your clinician may advise you to add an ankle weight if more resistance is helpful for you.  You should feel a stretch in the back of your right / left knee. Hold this position for __________ seconds. Repeat __________ times. Complete this stretch __________ times per day. * Your physician, physical therapist, or athletic trainer may ask you to add ankle weight to enhance your stretch.  RANGE OF MOTION - Knee Flexion, Active  Lie on your back with both knees straight. (If this causes back discomfort, bend your opposite knee, placing your foot flat on the floor.)  Slowly slide your heel back toward your buttocks until you feel a  gentle stretch in the front of your knee or thigh.  Hold for __________ seconds. Slowly slide your heel back to the starting position. Repeat __________ times. Complete this exercise __________ times per day.  STRETCH - Quadriceps, Prone   Lie on your stomach on a firm surface, such as a bed or padded floor.  Bend your right / left knee and grasp your ankle. If you are unable to reach your ankle or pant leg, use a belt around your foot to lengthen your reach.  Gently pull your heel toward your buttocks. Your knee should not slide out to the side. You should feel a stretch in the front of your thigh and/or knee.  Hold this position for __________ seconds. Repeat __________ times. Complete this stretch __________ times per day.  STRETCH - Hamstrings, Supine   Lie on your back. Loop a belt or towel over the ball of your right / left foot.  Straighten your right / left knee and slowly pull on the belt to raise your leg. Do not allow the right / left knee to bend. Keep your opposite leg flat on the floor.  Raise the leg until you feel a gentle stretch behind your right / left knee or thigh. Hold this position for __________ seconds. Repeat __________ times. Complete this stretch __________ times per day.  STRENGTHENING EXERCISES These exercises may help you when beginning to rehabilitate your injury. They may resolve your symptoms with or without further involvement from your physician, physical therapist, or athletic trainer. While completing these exercises,  remember:   Muscles can gain both the endurance and the strength needed for everyday activities through controlled exercises.  Complete these exercises as instructed by your physician, physical therapist, or athletic trainer. Progress the resistance and repetitions only as guided.  You may experience muscle soreness or fatigue, but the pain or discomfort you are trying to eliminate should never worsen during these exercises. If this  pain does worsen, stop and make certain you are following the directions exactly. If the pain is still present after adjustments, discontinue the exercise until you can discuss the trouble with your clinician. STRENGTH - Quadriceps, Isometrics  Lie on your back with your right / left leg extended and your opposite knee bent.  Gradually tense the muscles in the front of your right / left thigh. You should see either your knee cap slide up toward your hip or increased dimpling just above the knee. This motion will push the back of the knee down toward the floor/mat/bed on which you are lying.  Hold the muscle as tight as you can without increasing your pain for __________ seconds.  Relax the muscles slowly and completely in between each repetition. Repeat __________ times. Complete this exercise __________ times per day.  STRENGTH - Quadriceps, Short Arcs   Lie on your back. Place a __________ inch towel roll under your knee so that the knee slightly bends.  Raise only your lower leg by tightening the muscles in the front of your thigh. Do not allow your thigh to rise.  Hold this position for __________ seconds. Repeat __________ times. Complete this exercise __________ times per day.  OPTIONAL ANKLE WEIGHTS: Begin with ____________________, but DO NOT exceed ____________________. Increase in 1 pound/0.5 kilogram increments.  STRENGTH - Quadriceps, Straight Leg Raises  Quality counts! Watch for signs that the quadriceps muscle is working to insure you are strengthening the correct muscles and not "cheating" by substituting with healthier muscles.  Lay on your back with your right / left leg extended and your opposite knee bent.  Tense the muscles in the front of your right / left thigh. You should see either your knee cap slide up or increased dimpling just above the knee. Your thigh may even quiver.  Tighten these muscles even more and raise your leg 4 to 6 inches off the floor. Hold for  __________ seconds.  Keeping these muscles tense, lower your leg.  Relax the muscles slowly and completely in between each repetition. Repeat __________ times. Complete this exercise __________ times per day.  STRENGTH - Hamstring, Curls  Lay on your stomach with your legs extended. (If you lay on a bed, your feet may hang over the edge.)  Tighten the muscles in the back of your thigh to bend your right / left knee up to 90 degrees. Keep your hips flat on the bed/floor.  Hold this position for __________ seconds.  Slowly lower your leg back to the starting position. Repeat __________ times. Complete this exercise __________ times per day.  OPTIONAL ANKLE WEIGHTS: Begin with ____________________, but DO NOT exceed ____________________. Increase in 1 pound/0.5 kilogram increments.  STRENGTH - Quadriceps, Squats  Stand in a door frame so that your feet and knees are in line with the frame.  Use your hands for balance, not support, on the frame.  Slowly lower your weight, bending at the hips and knees. Keep your lower legs upright so that they are parallel with the door frame. Squat only within the range that does not increase  your knee pain. Never let your hips drop below your knees.  Slowly return upright, pushing with your legs, not pulling with your hands. Repeat __________ times. Complete this exercise __________ times per day.  STRENGTH - Quadriceps, Wall Slides  Follow guidelines for form closely. Increased knee pain often results from poorly placed feet or knees.  Lean against a smooth wall or door and walk your feet out 18-24 inches. Place your feet hip-width apart.  Slowly slide down the wall or door until your knees bend __________ degrees.* Keep your knees over your heels, not your toes, and in line with your hips, not falling to either side.  Hold for __________ seconds. Stand up to rest for __________ seconds in between each repetition. Repeat __________ times. Complete  this exercise __________ times per day. * Your physician, physical therapist, or athletic trainer will alter this angle based on your symptoms and progress.   This information is not intended to replace advice given to you by your health care provider. Make sure you discuss any questions you have with your health care provider.   Document Released: 05/27/2005 Document Revised: 08/03/2014 Document Reviewed: 10/25/2008 Elsevier Interactive Patient Education [employer].

## 2016-01-23 NOTE — Progress Notes (Signed)
Pre visit review using our clinic review tool, if applicable. No additional management support is needed unless otherwise documented below in the visit note. 

## 2016-01-24 ENCOUNTER — Other Ambulatory Visit (INDEPENDENT_AMBULATORY_CARE_PROVIDER_SITE_OTHER): Payer: BC Managed Care – PPO

## 2016-01-24 DIAGNOSIS — M791 Myalgia, unspecified site: Secondary | ICD-10-CM

## 2016-01-24 DIAGNOSIS — Z13220 Encounter for screening for lipoid disorders: Secondary | ICD-10-CM | POA: Diagnosis not present

## 2016-01-24 DIAGNOSIS — E669 Obesity, unspecified: Secondary | ICD-10-CM

## 2016-01-27 ENCOUNTER — Other Ambulatory Visit (INDEPENDENT_AMBULATORY_CARE_PROVIDER_SITE_OTHER): Payer: BC Managed Care – PPO

## 2016-01-27 DIAGNOSIS — Z13220 Encounter for screening for lipoid disorders: Secondary | ICD-10-CM | POA: Diagnosis not present

## 2016-01-27 LAB — COMPREHENSIVE METABOLIC PANEL
ALT: 10 U/L (ref 0–35)
AST: 13 U/L (ref 0–37)
Albumin: 3.9 g/dL (ref 3.5–5.2)
Alkaline Phosphatase: 74 U/L (ref 39–117)
BILIRUBIN TOTAL: 0.4 mg/dL (ref 0.2–1.2)
BUN: 13 mg/dL (ref 6–23)
CALCIUM: 9.6 mg/dL (ref 8.4–10.5)
CHLORIDE: 108 meq/L (ref 96–112)
CO2: 27 mEq/L (ref 19–32)
CREATININE: 0.9 mg/dL (ref 0.40–1.20)
GFR: 82.26 mL/min (ref >60.00)
GLUCOSE: 116 mg/dL — AB (ref 70–99)
Potassium: 3.8 mEq/L (ref 3.5–5.1)
SODIUM: 137 meq/L (ref 135–145)
Total Protein: 6.8 g/dL (ref 6.0–8.3)

## 2016-01-27 LAB — LIPID PANEL
CHOL/HDL RATIO: 5
Cholesterol: 203 mg/dL — ABNORMAL HIGH (ref 0–200)
HDL: 41.9 mg/dL (ref >39.00)
LDL Cholesterol: 133 mg/dL — ABNORMAL HIGH (ref 0–99)
NONHDL: 161.46
TRIGLYCERIDES: 144 mg/dL (ref 0.0–149.0)
VLDL: 28.8 mg/dL (ref 0.0–40.0)

## 2016-01-27 LAB — CK: Total CK: 102 U/L (ref 7–177)

## 2016-01-27 LAB — HEMOGLOBIN A1C: Hgb A1c MFr Bld: 6.3 % (ref 4.6–6.5)

## 2016-01-27 LAB — TSH: TSH: 1.92 u[IU]/mL (ref 0.35–4.50)

## 2016-01-29 ENCOUNTER — Telehealth: Payer: Self-pay | Admitting: *Deleted

## 2016-01-29 NOTE — Telephone Encounter (Signed)
Pt requested lab results  Pt contact [phone]

## 2016-01-29 NOTE — Telephone Encounter (Signed)
Spoke with patient, labs reviewed. See result note. thanks

## 2016-02-06 ENCOUNTER — Telehealth: Payer: Self-pay | Admitting: *Deleted

## 2016-02-06 MED ORDER — DICLOFENAC SODIUM 75 MG PO TBEC: 75.0000 mg | DELAYED_RELEASE_TABLET | Freq: Two times a day (BID) | ORAL | Status: DC

## 2016-02-06 NOTE — Telephone Encounter (Signed)
Please advise, this was refilled on 6/29, thanks

## 2016-02-06 NOTE — Telephone Encounter (Signed)
Refill sent to pharmacy.   

## 2016-02-06 NOTE — Telephone Encounter (Signed)
Pt requested a prior authorization and refill for diclofenac  Pharmacy [facility]

## 2016-02-14 ENCOUNTER — Encounter: Payer: BC Managed Care – PPO | Attending: Family Medicine | Admitting: Dietician

## 2016-02-14 ENCOUNTER — Encounter: Payer: Self-pay | Admitting: Dietician

## 2016-02-14 VITALS — Ht 64.0 in | Wt 251.2 lb

## 2016-02-14 DIAGNOSIS — E669 Obesity, unspecified: Secondary | ICD-10-CM | POA: Diagnosis not present

## 2016-02-14 NOTE — Patient Instructions (Signed)
   Make healthy food choices: vegetables, fruits, whole grain foods, lean protein foods.   Limit sodas to 3 per week to start, gradually decrease from there. Have an alternative, like flavored waters/ crystal light.   Gradually decrease sweet snacks and include more healthy snack options such as fruit with nuts, vanilla wafers, graham crackers, yogurt or lowfat pudding.  Consider some form of tracking your food intake, and weigh at least once a week.

## 2016-02-14 NOTE — Progress Notes (Signed)
Medical Nutrition Therapy: Visit start time: 1100  end time: 1200  Assessment:  Diagnosis: obesity Past medical history: slightly elevated cholesterol Psychosocial issues/ stress concerns: none; does some stress eating Preferred learning method:  . Visual  Current weight: 251.2lbs  Height: 5'4" Medications, supplements: reviewed list in chart with patient  Progress and evaluation: Patient reports gradual weight gain over years. She has lost some wieght in the past on weight watchers, but did not like the program. Lost more weight when taking phentermine, but regained when she stopped takng.    Wants a long-term plan for weight loss to relieve arthritis pain and feel better and maintain good health.   Physical activity: water aerobics, 45 minutes, 4 times a week  Dietary Intake:  Usual eating pattern includes 2-3 meals and 2 snacks per day. Dining out frequency: 6+ meals per week.  Breakfast: 7:30 when working; varies during summer: 3 slices bacon with boiled or scrambled egg; ham and cheese without biscuit 1/2-sweet tea, potato cake (2xa week). Takes breakfast to work the other days.  Snack: usually none Lunch: salad or pkg crackers and soda, or school cafeteria Snack: sometimes candy bar or snack cake and soda, not regularly Supper: rotisserie chicken, green beans or carrots. Sometimes skips or eats something simple like popcorn.  Snack: sometimes small cup of ice cream, or peanuts Beverages: water, some sodas  Nutrition Care Education: Topics covered: weight management Basic nutrition: basic food groups, appropriate nutrient balance, appropriate meal and snack schedule, general nutrition guidelines    Weight control: determining reasonable weight goal, behavioral changes for weight loss: managing stress eating, habit change, appropriate food portions and strategies for portion control, guidance for 1500kcal daily intake, importance of low fat and low sugar foods, healthy meal and snack  options. Other lifestyle changes:  benefits of regular exercise, as well as regular weighing and tracking food intake.  Nutritional Diagnosis:  [place]-3.3 Overweight/obesity As related to excess calories, history of inactivity.  As evidenced by patient report.  Intervention: Instruction as noted above.   Patient will work on a more structured eating pattern and balanced meals.    Set goals with patient input.  Education Materials given:  . Food lists/ Planning A Balanced Meal . Sample meal pattern/ menus: Quick and Healthy Meal Ideas . Snacking handout . Goals/ instructions  Learner/ who was taught:  . Patient   Level of understanding: Marland Kitchen Verbalizes/ demonstrates competency  Demonstrated degree of understanding via:   Teach back Learning barriers: . None  Willingness to learn/ readiness for change: . Acceptance, ready for change  Monitoring and Evaluation:  Dietary intake, exercise, and body weight      follow up: 03/13/16

## 2016-03-13 ENCOUNTER — Ambulatory Visit: Payer: BC Managed Care – PPO | Admitting: Dietician

## 2016-03-13 ENCOUNTER — Telehealth: Payer: Self-pay | Admitting: Family Medicine

## 2016-03-13 MED ORDER — DICLOFENAC SODIUM 75 MG PO TBEC: 75.0000 mg | DELAYED_RELEASE_TABLET | Freq: Two times a day (BID) | ORAL | 0 refills | Status: DC

## 2016-03-13 NOTE — Telephone Encounter (Signed)
Pt needs a refill on diclofenac (VOLTAREN) 75 MG EC tablet sent to [facility] [place].Marland Kitchen

## 2016-03-13 NOTE — Telephone Encounter (Signed)
RX filled.

## 2016-03-13 NOTE — Telephone Encounter (Signed)
Will Dr. Caryl Bis refill this?

## 2016-03-25 ENCOUNTER — Other Ambulatory Visit: Payer: Self-pay | Admitting: Family Medicine

## 2016-04-17 ENCOUNTER — Encounter: Payer: Self-pay | Admitting: Dietician

## 2016-04-17 NOTE — Progress Notes (Signed)
Have not heard back from patient to reschedule appointment on 03/13/16, which she cancelled. Sent discharge letter to MD.

## 2016-06-14 ENCOUNTER — Other Ambulatory Visit: Payer: Self-pay | Admitting: Family Medicine

## 2016-06-15 NOTE — Telephone Encounter (Signed)
Can you please call patient to schedule follow up

## 2016-06-15 NOTE — Telephone Encounter (Signed)
Sent to pharmacy. Please get her scheduled for follow-up. Thanks.  

## 2016-06-15 NOTE — Telephone Encounter (Signed)
Please advise on refill.

## 2016-06-16 ENCOUNTER — Telehealth: Payer: Self-pay | Admitting: Family Medicine

## 2016-06-16 NOTE — Telephone Encounter (Signed)
I called pt and left a vm for pt to call the office to sch a follow up appt. Thank you!

## 2016-06-16 NOTE — Telephone Encounter (Signed)
Ok. I called pt and left a vm to call office to sch appt.

## 2016-07-17 ENCOUNTER — Other Ambulatory Visit: Payer: Self-pay | Admitting: Family Medicine

## 2016-07-17 NOTE — Telephone Encounter (Signed)
Refilled 06-15-16. Pt last seen 01-23-16. Please advise?

## 2016-07-17 NOTE — Telephone Encounter (Signed)
Patient needs follow up appointment for further refills.

## 2016-09-03 ENCOUNTER — Ambulatory Visit (INDEPENDENT_AMBULATORY_CARE_PROVIDER_SITE_OTHER): Payer: BC Managed Care – PPO

## 2016-09-03 ENCOUNTER — Encounter: Payer: Self-pay | Admitting: Family Medicine

## 2016-09-03 ENCOUNTER — Ambulatory Visit: Payer: BC Managed Care – PPO | Admitting: Family Medicine

## 2016-09-03 ENCOUNTER — Ambulatory Visit (INDEPENDENT_AMBULATORY_CARE_PROVIDER_SITE_OTHER): Payer: BC Managed Care – PPO | Admitting: Family Medicine

## 2016-09-03 VITALS — BP 130/80 | HR 76 | Temp 98.5°F | Wt 257.4 lb

## 2016-09-03 DIAGNOSIS — Z5181 Encounter for therapeutic drug level monitoring: Secondary | ICD-10-CM | POA: Diagnosis not present

## 2016-09-03 DIAGNOSIS — R202 Paresthesia of skin: Secondary | ICD-10-CM

## 2016-09-03 DIAGNOSIS — M25562 Pain in left knee: Secondary | ICD-10-CM

## 2016-09-03 DIAGNOSIS — M1711 Unilateral primary osteoarthritis, right knee: Secondary | ICD-10-CM | POA: Diagnosis not present

## 2016-09-03 DIAGNOSIS — G8929 Other chronic pain: Secondary | ICD-10-CM | POA: Insufficient documentation

## 2016-09-03 LAB — BASIC METABOLIC PANEL
BUN: 9 mg/dL (ref 6–23)
CHLORIDE: 106 meq/L (ref 96–112)
CO2: 31 mEq/L (ref 19–32)
Calcium: 9.5 mg/dL (ref 8.4–10.5)
Creatinine, Ser: 0.88 mg/dL (ref 0.40–1.20)
GFR: 84.25 mL/min (ref >60.00)
Glucose, Bld: 96 mg/dL (ref 70–99)
POTASSIUM: 4.1 meq/L (ref 3.5–5.1)
SODIUM: 140 meq/L (ref 135–145)

## 2016-09-03 NOTE — Progress Notes (Signed)
Pre visit review using our clinic review tool, if applicable. No additional management support is needed unless otherwise documented below in the visit note. 

## 2016-09-03 NOTE — Patient Instructions (Signed)
Nice to see you. We will obtain an x-ray of your back. We'll refer you for physical therapy and sports medicine. We'll get some lab work to check her kidney function.

## 2016-09-03 NOTE — Assessment & Plan Note (Signed)
Patient with paresthesia of right leg anteriorly. Neurologically intact today. No back pain. We'll obtain a lumbar spine film. We'll refer to physical therapy given that she has improved with her water aerobics. She'll continue to monitor. She's given return precautions.

## 2016-09-03 NOTE — Assessment & Plan Note (Signed)
Patient with continued issues with her right knee. Discussed likely osteoarthritis versus degenerative meniscal issue. We'll send to physical therapy. We'll refer to sports medicine as well for potential ultrasound to evaluate for meniscal damage. She'll continue to monitor and take diclofenac. We'll check a BMP today.

## 2016-09-03 NOTE — Progress Notes (Signed)
  [name], MD Phone: [phone]  Margaret Stark is a 61 y.o. female who presents today for follow-up.  Patient notes continued issues with her right leg. Notes her knee does bother her some though she has numbness in the frontal aspect of her right leg at times. No posterior numbness. No back pain. Does note her right knee occasionally gives out on her. Some catching. No shooting pain. Her numbness has improved with doing some water aerobics. The diclofenac helps some and she is taking this once a day  PMH: Nonsmoker  ROS see history of present illness  Objective  Physical Exam Vitals:   09/03/16 0958  BP: 130/80  Pulse: 76  Temp: 98.5 F (36.9 C)    BP Readings from Last 3 Encounters:  09/03/16 130/80  01/23/16 120/76  07/15/15 111/69   Wt Readings from Last 3 Encounters:  09/03/16 257 lb 6.4 oz (116.8 kg)  02/14/16 251 lb 3.2 oz (113.9 kg)  01/23/16 251 lb 12.8 oz (114.2 kg)    Physical Exam  Constitutional: No distress.  Cardiovascular: Normal rate, regular rhythm and normal heart sounds.   Pulmonary/Chest: Effort normal and breath sounds normal.  Musculoskeletal:  No midline spine tenderness, no midline spine step-off, no muscular back tenderness bilateral knees with no swelling, warmth, erythema, or tenderness, negative McMurray's bilaterally, no ligamentous laxity bilaterally  Neurological: She is alert. Gait normal.  5 out of 5 strength bilateral quads, hamstrings, plantar flexion, and dorsiflexion, sensation to light touch intact bilateral lower extremities, 2+ patellar reflexes  Skin: She is not diaphoretic.     Assessment/Plan: Please see individual problem list.  Osteoarthritis of right knee Patient with continued issues with her right knee. Discussed likely osteoarthritis versus degenerative meniscal issue. We'll send to physical therapy. We'll refer to sports medicine as well for potential ultrasound to evaluate for meniscal damage. She'll  continue to monitor and take diclofenac. We'll check a BMP today.  Paresthesia of right leg Patient with paresthesia of right leg anteriorly. Neurologically intact today. No back pain. We'll obtain a lumbar spine film. We'll refer to physical therapy given that she has improved with her water aerobics. She'll continue to monitor. She's given return precautions.   Orders Placed This Encounter  Procedures  . DG Lumbar Spine Complete    Standing Status:   Future    Number of Occurrences:   1    Standing Expiration Date:   11/01/2017    Order Specific Question:   Reason for Exam (SYMPTOM  OR DIAGNOSIS REQUIRED)    Answer:   left leg numbness anteriorly, intermittent pain in left leg    Order Specific Question:   Is patient pregnant?    Answer:   No    Order Specific Question:   Preferred imaging location?    Answer:   [facility]  . Basic metabolic panel  . Ambulatory referral to Sports Medicine    Referral Priority:   Routine    Referral Type:   Consultation    Number of Visits Requested:   1  . Ambulatory referral to Physical Therapy    Referral Priority:   Routine    Referral Type:   Physical Medicine    Referral Reason:   Specialty Services Required    Requested Specialty:   Physical Therapy    Number of Visits Requested:   1    [name], MD [place]

## 2016-09-05 ENCOUNTER — Other Ambulatory Visit: Payer: Self-pay | Admitting: Family Medicine

## 2016-09-07 ENCOUNTER — Encounter: Payer: Self-pay | Admitting: Family Medicine

## 2016-09-08 ENCOUNTER — Telehealth: Payer: Self-pay | Admitting: Family Medicine

## 2016-09-08 NOTE — Telephone Encounter (Signed)
Pt called back returning your call. Thank you!  Call pt @ [phone]

## 2016-09-08 NOTE — Telephone Encounter (Signed)
See result note.  

## 2016-09-09 ENCOUNTER — Other Ambulatory Visit: Payer: Self-pay | Admitting: Family Medicine

## 2016-09-09 DIAGNOSIS — M545 Low back pain, unspecified: Secondary | ICD-10-CM

## 2016-09-09 DIAGNOSIS — G8929 Other chronic pain: Secondary | ICD-10-CM

## 2016-09-15 ENCOUNTER — Other Ambulatory Visit: Payer: Self-pay | Admitting: Family Medicine

## 2016-09-15 DIAGNOSIS — M545 Low back pain: Principal | ICD-10-CM

## 2016-09-15 DIAGNOSIS — G8929 Other chronic pain: Secondary | ICD-10-CM

## 2016-09-18 NOTE — Progress Notes (Signed)
[facility] [place] [place], [place] [address] Phone: [phone] Subjective:    I'm seeing this patient by the request  of:    CC: Right knee pain   [id number]  [name] is a 61 y.o. female coming in with complaint of right knee pain. Patient has seen primary care provider for this. Has been doing exercises previously and also went to formal physical therapy patient states Unfortunate she continues to have pain overall. Patient states that it seems to be more on the medial aspect of the knees. Seems to be worse with activity such as going up or down stairs. Sometimes can of a throbbing sensation. Starting have some radiation down the legs as well. Patient is being workup for her back but per Medicare and is having an MRI in the near future. Patient states that sometimes it seems that her knees have swelling. Has tried over-the-counter medications with very minimal improvement. Physical therapy is also not been significantly helpful. Patient was being more active but finds it difficult secondary to the pain.     Past Medical History:  Diagnosis Date  . Arthritis    Past Surgical History:  Procedure Laterality Date  . COLONOSCOPY WITH PROPOFOL N/A 07/15/2015   Procedure: COLONOSCOPY WITH PROPOFOL;  Surgeon: Hulen Luster, MD;  Location: [facility] ENDOSCOPY;  Service: Gastroenterology;  Laterality: N/A;   Social History   Social History  . Marital status: Married    Spouse name: N/A  . Number of children: N/A  . Years of education: N/A   Social History Main Topics  . Smoking status: Never Smoker  . Smokeless tobacco: Never Used  . Alcohol use 0.0 oz/week  . Drug use: No  . Sexual activity: Not Asked   Other Topics Concern  . None   Social History Narrative  . None   No Known Allergies Family History  Problem Relation Age of Onset  . Hypertension Mother   . Hypertension Father   . Cancer Sister     colon - age 38    Past  medical history, social, surgical and family history all reviewed in electronic medical record.  No pertanent information unless stated regarding to the chief complaint.   Review of Systems: No headache, visual changes, nausea, vomiting, diarrhea, constipation, dizziness, abdominal pain, skin rash, fevers, chills, night sweats, weight loss, swollen lymph nodes, body aches, joint swelling, muscle aches, chest pain, shortness of breath, mood changes.    Objective  Blood pressure 128/80, pulse 88, height 5\' 4"  (1.626 m), weight 256 lb (116.1 kg), SpO2 99 %. Systems examined below as of 09/21/16   General: No apparent distress alert and oriented x3 mood and affect normal, dressed appropriately.  HEENT: Pupils equal, extraocular movements intact  Respiratory: Patient's speak in full sentences and does not appear short of breath  Cardiovascular: No lower extremity edema, non tender, no erythema  Skin: Warm dry intact with no signs of infection or rash on extremities or on axial skeleton.  Abdomen: Soft nontender  Neuro: Cranial nerves II through XII are intact, neurovascularly intact in all extremities with 2+ DTRs and 2+ pulses.  Lymph: No lymphadenopathy of posterior or anterior cervical chain or axillae bilaterally.  Gait normal with good balance and coordination.  MSK:  Non tender with full range of motion and good stability and symmetric strength and tone of shoulders, elbows, wrist, hip, and ankles bilaterally.  Knee: Bilateral valgus deformity noted. Large thigh to calf  ratio.  Tender to palpation over medial and PF joint line.  ROM full in flexion and extension and lower leg rotation. instability with valgus force.  painful patellar compression. Patellar glide with moderate crepitus. Patellar and quadriceps tendons unremarkable. Hamstring and quadriceps strength is normal.   MSK US performed of: Bilateral This study was ordered, performed, and interpreted by Charlann Boxer  D.O.  Knee: All structures visualized. Moderate to severe narrowing of the medial and patellofemoral joint bilaterally right greater than left. Trace effusion noted of the right suprapatellar pouch. Patient does have chronic degenerative changes of the meniscus bilaterally.  IMPRESSION:  Moderate to severe knee arthritis  Procedure note [id number]; 15 minutes spent for Therapeutic exercises as stated in above notes.  This included exercises focusing on stretching, strengthening, with significant focus on eccentric aspects.   Extension exercises working on hip abductor strengthening as well as vastus medialis obliques strengthening. Proper technique shown and discussed handout in great detail with ATC.  All questions were discussed and answered.      Impression and Recommendations:     This case required medical decision making of moderate complexity.      Note: This dictation was prepared with Dragon dictation along with smaller phrase technology. Any transcriptional errors that result from this process are unintentional.

## 2016-09-20 ENCOUNTER — Other Ambulatory Visit: Payer: Self-pay | Admitting: Family Medicine

## 2016-09-21 ENCOUNTER — Encounter: Payer: Self-pay | Admitting: Family Medicine

## 2016-09-21 ENCOUNTER — Ambulatory Visit (INDEPENDENT_AMBULATORY_CARE_PROVIDER_SITE_OTHER): Payer: BC Managed Care – PPO | Admitting: Family Medicine

## 2016-09-21 ENCOUNTER — Ambulatory Visit: Payer: Self-pay

## 2016-09-21 VITALS — BP 128/80 | HR 88 | Ht 64.0 in | Wt 256.0 lb

## 2016-09-21 DIAGNOSIS — M170 Bilateral primary osteoarthritis of knee: Secondary | ICD-10-CM | POA: Diagnosis not present

## 2016-09-21 DIAGNOSIS — M25561 Pain in right knee: Principal | ICD-10-CM

## 2016-09-21 DIAGNOSIS — M25562 Pain in left knee: Principal | ICD-10-CM

## 2016-09-21 DIAGNOSIS — G8929 Other chronic pain: Secondary | ICD-10-CM | POA: Diagnosis not present

## 2016-09-21 MED ORDER — DICLOFENAC SODIUM 2 % TD SOLN: 2.0000 "application " | Freq: Two times a day (BID) | TRANSDERMAL | 3 refills | Status: DC

## 2016-09-21 MED ORDER — VITAMIN D (ERGOCALCIFEROL) 1.25 MG (50000 UNIT) PO CAPS: 50000.0000 [IU] | ORAL_CAPSULE | ORAL | 0 refills | Status: DC

## 2016-09-21 NOTE — Assessment & Plan Note (Signed)
Patient does have moderate arthritis noted. X-rays pending. We discussed icing regimen. Topical anti-inflammatories given. Once weekly vitamin D given for muscle strength and endurance. Discussed with patient about what activities to avoid a lower impact exercises. We discussed which activities to avoid. Follow-up with me again in 4 weeks.

## 2016-09-21 NOTE — Telephone Encounter (Signed)
It appears that she saw Dr. Tamala Julian today. It does not appear that he included diclofenac in her treatment regimen. I would thus suggest following his regimen to see if that is beneficial.

## 2016-09-21 NOTE — Telephone Encounter (Signed)
Last OV 09/03/2016 last filled 09/07/16 30 0rf

## 2016-09-21 NOTE — Patient Instructions (Addendum)
Good to see you.  Ice 20 minutes 2 times daily. Usually after activity and before bed. Exercises 3 times a week.  pennsaid pinkie amount topically 2 times daily as needed.  Once weekly vitamin D for 12 weeks.  I will look for you back MRI.  S[employer]co orthotics "total support" online would be great  Good shoes with rigid bottom.  Jalene Mullet, [place] or New balance greater then 700 Turmeric 500mg  daily  Tart cherry extract any dose at night.  See me again in 4 weeks and if not better we will inject

## 2016-09-24 ENCOUNTER — Telehealth: Payer: Self-pay | Admitting: Family Medicine

## 2016-09-24 ENCOUNTER — Ambulatory Visit: Payer: BC Managed Care – PPO

## 2016-09-24 NOTE — Telephone Encounter (Signed)
Pt called wanting to know if she can get a valium because she has to get a MRI on this 09/26/2016? Please advise?  Pharmacy is [id number] - [place], [place]  Call pt @ [phone]. Thank you!

## 2016-09-24 NOTE — Telephone Encounter (Signed)
Please advise 

## 2016-09-25 MED ORDER — DIAZEPAM 5 MG PO TABS: 5.0000 mg | ORAL_TABLET | Freq: Once | ORAL | 0 refills | Status: AC

## 2016-09-25 NOTE — Telephone Encounter (Signed)
Patient notified

## 2016-09-25 NOTE — Telephone Encounter (Signed)
Please fax to local pharmacy. Patient will need to have someone transport her to and from the MRI given she will be taking this medication.

## 2016-09-26 ENCOUNTER — Ambulatory Visit
Admission: RE | Admit: 2016-09-26 | Discharge: 2016-09-26 | Disposition: A | Discharge status: Home / Self Care | Payer: BC Managed Care – PPO | Source: Ambulatory Visit | Attending: Family Medicine | Admitting: Family Medicine

## 2016-09-26 DIAGNOSIS — M545 Low back pain: Principal | ICD-10-CM

## 2016-09-26 DIAGNOSIS — G8929 Other chronic pain: Secondary | ICD-10-CM

## 2016-10-02 ENCOUNTER — Other Ambulatory Visit: Payer: Self-pay | Admitting: Family Medicine

## 2016-10-02 ENCOUNTER — Ambulatory Visit: Payer: BC Managed Care – PPO | Attending: Family Medicine | Admitting: Physical Therapy

## 2016-10-02 DIAGNOSIS — R262 Difficulty in walking, not elsewhere classified: Secondary | ICD-10-CM | POA: Insufficient documentation

## 2016-10-02 DIAGNOSIS — G8929 Other chronic pain: Secondary | ICD-10-CM | POA: Diagnosis present

## 2016-10-02 DIAGNOSIS — M5442 Lumbago with sciatica, left side: Secondary | ICD-10-CM | POA: Insufficient documentation

## 2016-10-02 DIAGNOSIS — M25562 Pain in left knee: Secondary | ICD-10-CM | POA: Insufficient documentation

## 2016-10-02 DIAGNOSIS — M25561 Pain in right knee: Secondary | ICD-10-CM | POA: Diagnosis present

## 2016-10-02 DIAGNOSIS — M5136 Other intervertebral disc degeneration, lumbar region: Secondary | ICD-10-CM
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
## 2016-10-05 ENCOUNTER — Other Ambulatory Visit: Payer: Self-pay | Admitting: Family Medicine

## 2016-10-05 ENCOUNTER — Ambulatory Visit: Payer: BC Managed Care – PPO | Admitting: Physical Therapy

## 2016-10-05 NOTE — Telephone Encounter (Signed)
Please advise refill, last refilled on 09/07/2016, for 30 days

## 2016-10-05 NOTE — Telephone Encounter (Signed)
It appears that she is supposed to be doing topical medications per review of Dr. Thompson Caul note. Please see if she has continued to take the diclofenac pill. Thanks.

## 2016-10-06 NOTE — Telephone Encounter (Signed)
Left a VM for patient to return my call. thanks

## 2016-10-07 NOTE — Telephone Encounter (Signed)
Left a VM that medication was refused as I had discussed with her prior.  Thanks

## 2016-10-07 NOTE — Telephone Encounter (Signed)
Pt requested a return call [phone]

## 2016-10-07 NOTE — Telephone Encounter (Signed)
Attempted to reach patient, left a VM  

## 2016-10-07 NOTE — Telephone Encounter (Signed)
I would stick with the topicals if they have been beneficial for her knees. Thanks.

## 2016-10-07 NOTE — Telephone Encounter (Signed)
She is using the topical on her knee, but thought she was suppose to use the pill also.  She was not sure.  Please advise, thanks

## 2016-10-09 ENCOUNTER — Ambulatory Visit: Payer: BC Managed Care – PPO | Admitting: Physical Therapy

## 2016-10-09 DIAGNOSIS — R262 Difficulty in walking, not elsewhere classified: Secondary | ICD-10-CM

## 2016-10-09 DIAGNOSIS — M25561 Pain in right knee: Secondary | ICD-10-CM | POA: Diagnosis not present

## 2016-10-09 DIAGNOSIS — M5442 Lumbago with sciatica, left side: Secondary | ICD-10-CM

## 2016-10-09 DIAGNOSIS — M25562 Pain in left knee: Secondary | ICD-10-CM

## 2016-10-09 DIAGNOSIS — G8929 Other chronic pain: Secondary | ICD-10-CM

## 2016-10-09 NOTE — Patient Instructions (Signed)
Soft tissue mobilization to medial retinaculum of R knee   BOSU standing hip abductions x10, x 8  Stairs x 8  Prone ely's stretch on RLE x 3 minutes   TRX sit to stands x10 for 2 sets
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
## 2016-10-13 ENCOUNTER — Ambulatory Visit: Payer: BC Managed Care – PPO | Admitting: Physical Therapy

## 2016-10-15 ENCOUNTER — Ambulatory Visit: Payer: BC Managed Care – PPO | Admitting: Physical Therapy

## 2016-10-15 DIAGNOSIS — G8929 Other chronic pain: Secondary | ICD-10-CM

## 2016-10-15 DIAGNOSIS — M25561 Pain in right knee: Secondary | ICD-10-CM | POA: Diagnosis not present

## 2016-10-15 DIAGNOSIS — M25562 Pain in left knee: Secondary | ICD-10-CM

## 2016-10-15 DIAGNOSIS — R262 Difficulty in walking, not elsewhere classified: Secondary | ICD-10-CM

## 2016-10-15 DIAGNOSIS — M5442 Lumbago with sciatica, left side: Secondary | ICD-10-CM

## 2016-10-15 NOTE — Patient Instructions (Addendum)
Ely's test for low load, long duration x 3 minutes for 2 sets per leg (bilaterally)   Standing hip abduction with red theraband 2 sets x 12 repetitions   Single leg deadlifts with HHA x 12 per side (progressed to stance leg on air-ex pad x  Step ups with 1 riser and HHA x 10 per LE (no pain increase reported).
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
## 2016-10-20 ENCOUNTER — Ambulatory Visit: Payer: BC Managed Care – PPO | Admitting: Physical Therapy

## 2016-10-20 DIAGNOSIS — G8929 Other chronic pain: Secondary | ICD-10-CM

## 2016-10-20 DIAGNOSIS — R262 Difficulty in walking, not elsewhere classified: Secondary | ICD-10-CM

## 2016-10-20 DIAGNOSIS — M25562 Pain in left knee: Secondary | ICD-10-CM

## 2016-10-20 DIAGNOSIS — M25561 Pain in right knee: Secondary | ICD-10-CM | POA: Diagnosis not present

## 2016-10-20 DIAGNOSIS — M5442 Lumbago with sciatica, left side: Secondary | ICD-10-CM

## 2016-10-20 NOTE — Patient Instructions (Addendum)
Matrix hip abductions 40# x 10 repetitions for 3 sets   Deadlifts 3 sets x6 repetitions (20#) cuing for technique and set up   Able to ascend/descend stairs with hand hold assistance
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
## 2016-10-22 ENCOUNTER — Ambulatory Visit: Payer: BC Managed Care – PPO | Admitting: Physical Therapy

## 2016-10-24 NOTE — Progress Notes (Signed)
[facility] [place], Margaret Stark [address] Phone: [phone] Subjective:    I'm seeing this patient by the request  of:    CC: Right knee pain f/u   BTD:[id number]  Margaret Stark is a 61 y.o. female coming in with complaint of right knee pain. Patient was previously seen and was diagnosed with moderate to severe osteophytic changes the knees bilaterally. Patient has been going to formal physical therapy.Patient is also been doing topical anti-inflammatories. Patient states Overall patient has noticed that she has had increase in movement but still has some pain. Still affecting some daily activities. Patient still has some instability as well.     Past Medical History:  Diagnosis Date  . Arthritis    Past Surgical History:  Procedure Laterality Date  . COLONOSCOPY WITH PROPOFOL N/A 07/15/2015   Procedure: COLONOSCOPY WITH PROPOFOL;  Surgeon: Hulen Luster, MD;  Location: [facility] ENDOSCOPY;  Service: Gastroenterology;  Laterality: N/A;   Social History   Social History  . Marital status: Married    Spouse name: N/A  . Number of children: N/A  . Years of education: N/A   Social History Main Topics  . Smoking status: Never Smoker  . Smokeless tobacco: Never Used  . Alcohol use 0.0 oz/week  . Drug use: No  . Sexual activity: Not on file   Other Topics Concern  . Not on file   Social History Narrative  . No narrative on file   No Known Allergies Family History  Problem Relation Age of Onset  . Hypertension Mother   . Hypertension Father   . Cancer Sister     colon - age 33    Past medical history, social, surgical and family history all reviewed in electronic medical record.  No pertanent information unless stated regarding to the chief complaint.   Review of Systems: No headache, visual changes, nausea, vomiting, diarrhea, constipation, dizziness, abdominal pain, skin rash, fevers, chills, night sweats, weight loss, swollen  lymph nodes,  chest pain, shortness of breath, mood changes.  Positive muscle aches and joint swelling.      Objective  Blood pressure 122/76, pulse 84, resp. rate 16, weight 252 lb 2 oz (114.4 kg), SpO2 98 %.   Systems examined below as of 10/26/16 General: NAD A&O x3 mood, affect normal  HEENT: Pupils equal, extraocular movements intact no nystagmus Respiratory: not short of breath at rest or with speaking Cardiovascular: No lower extremity edema, non tender Skin: Warm dry intact with no signs of infection or rash on extremities or on axial skeleton. Abdomen: Soft nontender, no masses Neuro: Cranial nerves  intact, neurovascularly intact in all extremities with 2+ DTRs and 2+ pulses. Lymph: No lymphadenopathy appreciated today .  Gait Worsening antalgic gait.  MSK: Non tender with full range of motion and good stability and symmetric strength and tone of shoulders, elbows, wrist,  knee hips and ankles bilaterally.   Knee: Bilateral valgus deformity noted. Large thigh to calf ratio.  Tender to palpation over medial and PF joint line.  ROM full in flexion and extension and lower leg rotation. instability with valgus force.  painful patellar compression. Patellar glide with moderate crepitus. Patellar and quadriceps tendons unremarkable. Hamstring and quadriceps strength is normal.    After informed written and verbal consent, patient was seated on exam table. Right knee was prepped with alcohol swab and utilizing anterolateral approach, patient's right knee space was injected with 4:1  marcaine 0.5%: Kenalog  40mg /dL. Patient tolerated the procedure well without immediate complications.  After informed written and verbal consent, patient was seated on exam table. Left knee was prepped with alcohol swab and utilizing anterolateral approach, patient's left knee space was injected with 4:1  marcaine 0.5%: Kenalog 40mg /dL. Patient tolerated the procedure well without immediate  complications.     Impression and Recommendations:     This case required medical decision making of moderate complexity.      Note: This dictation was prepared with Dragon dictation along with smaller phrase technology. Any transcriptional errors that result from this process are unintentional.

## 2016-10-26 ENCOUNTER — Encounter: Payer: Self-pay | Admitting: Family Medicine

## 2016-10-26 ENCOUNTER — Ambulatory Visit (INDEPENDENT_AMBULATORY_CARE_PROVIDER_SITE_OTHER): Payer: BC Managed Care – PPO | Admitting: Family Medicine

## 2016-10-26 DIAGNOSIS — M170 Bilateral primary osteoarthritis of knee: Secondary | ICD-10-CM | POA: Diagnosis not present

## 2016-10-26 NOTE — Patient Instructions (Addendum)
Good to see you  Margaret Stark is your friend.  We tried an injection today  Keep doing the exercises 2-3 times a week  Wear the good shoes See me again in 4 weeks. If not better we can consider synivsc can check with your insurance if you want.  Synvisc [id number]

## 2016-10-26 NOTE — Progress Notes (Signed)
Pre-visit discussion using our clinic review tool. No additional management support is needed unless otherwise documented below in the visit note.  

## 2016-10-26 NOTE — Assessment & Plan Note (Signed)
Bilateral injections given today due to worsening symptoms. We discussed once likely. Vitamin D. We discussed home exercises and topical anti-inflammatories. Patient responded well to the injections. Follow-up again in 4-6 weeks with her continuing conservative therapy. Worsening symptoms we'll consider viscous supplementation.

## 2016-11-02 ENCOUNTER — Encounter: Payer: BC Managed Care – PPO | Admitting: Physical Therapy

## 2016-11-10 ENCOUNTER — Ambulatory Visit: Payer: BC Managed Care – PPO | Admitting: Physical Therapy

## 2016-11-12 ENCOUNTER — Encounter: Payer: BC Managed Care – PPO | Admitting: Physical Therapy

## 2016-11-17 ENCOUNTER — Encounter: Payer: BC Managed Care – PPO | Admitting: Physical Therapy

## 2016-11-19 ENCOUNTER — Encounter: Payer: BC Managed Care – PPO | Admitting: Physical Therapy

## 2016-12-18 ENCOUNTER — Other Ambulatory Visit: Payer: Self-pay | Admitting: Family Medicine

## 2017-01-11 ENCOUNTER — Ambulatory Visit: Payer: BC Managed Care – PPO | Admitting: Family Medicine

## 2017-01-29 LAB — HM PAP SMEAR
HM Pap smear: NEGATIVE
HPV 16/18/45 genotyping: NEGATIVE

## 2018-11-11 ENCOUNTER — Other Ambulatory Visit: Payer: Self-pay | Admitting: Family Medicine

## 2018-11-11 ENCOUNTER — Encounter: Payer: Self-pay | Admitting: Family Medicine

## 2018-11-11 ENCOUNTER — Telehealth: Payer: Self-pay | Admitting: Family Medicine

## 2018-11-11 ENCOUNTER — Ambulatory Visit (INDEPENDENT_AMBULATORY_CARE_PROVIDER_SITE_OTHER): Payer: BC Managed Care – PPO | Admitting: Family Medicine

## 2018-11-11 ENCOUNTER — Other Ambulatory Visit: Payer: Self-pay

## 2018-11-11 DIAGNOSIS — R7303 Prediabetes: Secondary | ICD-10-CM

## 2018-11-11 DIAGNOSIS — M170 Bilateral primary osteoarthritis of knee: Secondary | ICD-10-CM

## 2018-11-11 DIAGNOSIS — Z1321 Encounter for screening for nutritional disorder: Secondary | ICD-10-CM

## 2018-11-11 MED ORDER — DICLOFENAC SODIUM 2 % TD SOLN: 2.0000 "application " | Freq: Two times a day (BID) | TRANSDERMAL | 1 refills | Status: DC

## 2018-11-11 NOTE — Assessment & Plan Note (Signed)
Discussed the importance of exercise and weight loss to help take stress off of her knees.  We will refill topical diclofenac.  She will monitor.  Plan to check vitamin D in 2 months.

## 2018-11-11 NOTE — Telephone Encounter (Signed)
Called and spoke with pt. Pt has been scheduled for lab appt and 4 month follow up OV.

## 2018-11-11 NOTE — Assessment & Plan Note (Signed)
Encouraged increasing walking to 5 days a week.  We will send her dietary information in the mail.

## 2018-11-11 NOTE — Progress Notes (Signed)
Virtual Visit via telephone Note  This visit type was conducted due to national recommendations for restrictions regarding the COVID-19 pandemic (e.g. social distancing).  This format is felt to be most appropriate for this patient at this time.  All issues noted in this document were discussed and addressed.  No physical exam was performed (except for noted visual exam findings with Video Visits).   I connected with Margaret Stark on 11/11/18 at 10:30 AM EDT by telephone and verified that I am speaking with the correct person using two identifiers. Location patient: home Location provider: work Persons participating in the virtual visit: patient, provider  I discussed the limitations, risks, security and privacy concerns of performing an evaluation and management service by telephone and the availability of in person appointments. I also discussed with the patient that there may be a patient responsible charge related to this service. The patient expressed understanding and agreed to proceed.  Reason for visit: Follow-up.  HPI: Bilateral knee osteoarthritis: Right greater than left.  She notes Aleve does help and exercise does help.  They do not lock or give out on her.  She notes they feel stiff at times if she is been seated for a long period of time.  Topical diclofenac was beneficial previously.  She would like a refill on this.  She also wants to know about getting a letter to be able to use the elevator at work when she goes back to work likely sometime in August. Patient previously placed on prescription strength vitamin D supplement.  She has been out of this and has not been on any vitamin D.  Prediabetes: No polyuria or polydipsia.  She has been walking 3 days a week for 30 to 45 minutes at a time.  She has not been watching her diet.  She has been snacking on cookies and crackers.  She wants to change her eating habits.   ROS: See pertinent positives and negatives per HPI.  Past  Medical History:  Diagnosis Date  . Arthritis     Past Surgical History:  Procedure Laterality Date  . COLONOSCOPY WITH PROPOFOL N/A 07/15/2015   Procedure: COLONOSCOPY WITH PROPOFOL;  Surgeon: Hulen Luster, MD;  Location: [facility] ENDOSCOPY;  Service: Gastroenterology;  Laterality: N/A;    Family History  Problem Relation Age of Onset  . Hypertension Mother   . Hypertension Father   . Cancer Sister        colon - age 51    SOCIAL HX: Non-smoker.   Current Outpatient Medications:  .  Diclofenac Sodium (PENNSAID) 2 % SOLN, Place 2 application onto the skin 2 (two) times daily., Disp: 112 g, Rfl: 1 .  latanoprost (XALATAN) 0.005 % ophthalmic solution, INSTILL 1 DROP IN EACH EYE AT BEDTIME, Disp: , Rfl: 6  EXAM: No physical exam was completed as this was a telephone telehealth visit.  ASSESSMENT AND PLAN:   Discussed the following assessment and plan:  Primary osteoarthritis of both knees  Prediabetes  Degenerative arthritis of knee, bilateral Discussed the importance of exercise and weight loss to help take stress off of her knees.  We will refill topical diclofenac.  She will monitor.  Plan to check vitamin D in 2 months.  Prediabetes Encouraged increasing walking to 5 days a week.  We will send her dietary information in the mail.  We will plan to request records from her gynecologist regarding her Pap smear and mammogram when she comes into the office for follow-up.  I  advised that I would consider writing a letter for her to use the elevator when she is about to go back to work.  Social distancing precautions and sick precautions discussed with patient regarding COVID-19.   I discussed the assessment and treatment plan with the patient. The patient was provided an opportunity to ask questions and all were answered. The patient agreed with the plan and demonstrated an understanding of the instructions.   The patient was advised to call back or seek an in-person  evaluation if the symptoms worsen or if the condition fails to improve as anticipated.  I provided 23 minutes of non-face-to-face time during this encounter.   [name], MD

## 2018-11-11 NOTE — Telephone Encounter (Signed)
Please contact the patient and get her set up for lab work in 2 months.  Please get her set up for follow-up in the office in 4 months.

## 2018-11-11 NOTE — Patient Instructions (Signed)
Diet Recommendations  Starchy (carb) foods: Bread, rice, pasta, potatoes, corn, cereal, grits, crackers, bagels, muffins, all baked goods.  (Fruits, milk, and yogurt also have carbohydrate, but most of these foods will not spike your blood sugar as the starchy foods will.)  A few fruits do cause high blood sugars; use small portions of bananas (limit to 1/2 at a time), grapes, watermelon, oranges, and most tropical fruits.    Protein foods: Meat, fish, poultry, eggs, dairy foods, and beans such as pinto and kidney beans (beans also provide carbohydrate).   1. Eat at least 3 meals and 1-2 snacks per day. Never go more than 4-5 hours while awake without eating. Eat breakfast within the first hour of getting up.   2. Limit starchy foods to TWO per meal and ONE per snack. ONE portion of a starchy  food is equal to the following:   - ONE slice of bread (or its equivalent, such as half of a hamburger bun).   - 1/2 cup of a "scoopable" starchy food such as potatoes or rice.   - 15 grams of carbohydrate as shown on food label.  3. Include at every meal: a protein food, a carb food, and vegetables and/or fruit.   - Obtain twice the volume of veg's as protein or carbohydrate foods for both lunch and dinner.   - Fresh or frozen veg's are best.   - Keep frozen veg's on hand for a quick vegetable serving.    

## 2018-11-12 MED ORDER — DICLOFENAC SODIUM 1 % TD GEL: 4.0000 g | Freq: Four times a day (QID) | TRANSDERMAL | 1 refills | Status: DC | PRN

## 2018-11-12 NOTE — Telephone Encounter (Signed)
voltaren gel sent to pharmacy

## 2018-11-18 ENCOUNTER — Telehealth: Payer: Self-pay

## 2018-11-18 NOTE — Telephone Encounter (Signed)
PA DONE WAITING ON APPROVAL   Margaret Stark Key: [id number] - PA Case ID: [id number] - Rx #: [id number] Need help? Call us at [phone]  Status  Sent to [employer]  DrugDiclofenac Sodium 1% gel  [employer] Electronic PA Form (NCPDP)  Original Claim [place] REQ-MD CALL [phone].DRUG REQUIRES PRIOR AUTHORIZATION

## 2018-11-21 NOTE — Telephone Encounter (Signed)
Asjah Coppedge Key: AGDFJENT - PA Case ID: [id number] - Rx #: [id number] Need help? Call us at [phone]  Outcome  Appro[employer]on April 24  Your PA request has been approved. Additional information will be provided in the approval communication. (Message 1145)

## 2018-11-24 ENCOUNTER — Emergency Department (HOSPITAL_COMMUNITY): Payer: BC Managed Care – PPO

## 2018-11-24 ENCOUNTER — Observation Stay (HOSPITAL_COMMUNITY)
Admission: EM | Admit: 2018-11-24 | Discharge: 2018-11-24 | Disposition: A | Discharge status: Home / Self Care | Payer: BC Managed Care – PPO | Attending: Cardiology | Admitting: Cardiology

## 2018-11-24 ENCOUNTER — Other Ambulatory Visit: Payer: Self-pay

## 2018-11-24 ENCOUNTER — Ambulatory Visit (HOSPITAL_COMMUNITY)
Admission: EM | Disposition: A | Discharge status: Home / Self Care | Payer: Self-pay | Source: Home / Self Care | Attending: Emergency Medicine

## 2018-11-24 ENCOUNTER — Encounter (HOSPITAL_COMMUNITY): Payer: Self-pay | Admitting: Emergency Medicine

## 2018-11-24 DIAGNOSIS — I214 Non-ST elevation (NSTEMI) myocardial infarction: Secondary | ICD-10-CM

## 2018-11-24 DIAGNOSIS — I2510 Atherosclerotic heart disease of native coronary artery without angina pectoris: Secondary | ICD-10-CM | POA: Diagnosis not present

## 2018-11-24 DIAGNOSIS — R079 Chest pain, unspecified: Secondary | ICD-10-CM

## 2018-11-24 DIAGNOSIS — R7989 Other specified abnormal findings of blood chemistry: Secondary | ICD-10-CM

## 2018-11-24 DIAGNOSIS — M1990 Unspecified osteoarthritis, unspecified site: Secondary | ICD-10-CM | POA: Insufficient documentation

## 2018-11-24 DIAGNOSIS — Z79899 Other long term (current) drug therapy: Secondary | ICD-10-CM | POA: Insufficient documentation

## 2018-11-24 DIAGNOSIS — R778 Other specified abnormalities of plasma proteins: Secondary | ICD-10-CM

## 2018-11-24 DIAGNOSIS — E669 Obesity, unspecified: Secondary | ICD-10-CM | POA: Diagnosis not present

## 2018-11-24 DIAGNOSIS — R0600 Dyspnea, unspecified: Secondary | ICD-10-CM

## 2018-11-24 HISTORY — DX: Atherosclerotic heart disease of native coronary artery without angina pectoris: I25.10

## 2018-11-24 HISTORY — PX: LEFT HEART CATH AND CORONARY ANGIOGRAPHY: CATH118249

## 2018-11-24 LAB — CBC WITH DIFFERENTIAL/PLATELET
Abs Immature Granulocytes: 0.01 10*3/uL (ref 0.00–0.07)
Basophils Absolute: 0 10*3/uL (ref 0.0–0.1)
Basophils Relative: 1 %
Eosinophils Absolute: 0.1 10*3/uL (ref 0.0–0.5)
Eosinophils Relative: 3 %
HCT: 39.6 % (ref 36.0–46.0)
Hemoglobin: 12.4 g/dL (ref 12.0–15.0)
Immature Granulocytes: 0 %
Lymphocytes Relative: 41 %
Lymphs Abs: 1.7 10*3/uL (ref 0.7–4.0)
MCH: 27.7 pg (ref 26.0–34.0)
MCHC: 31.3 g/dL (ref 30.0–36.0)
MCV: 88.6 fL (ref 80.0–100.0)
Monocytes Absolute: 0.4 10*3/uL (ref 0.1–1.0)
Monocytes Relative: 11 %
Neutro Abs: 1.9 10*3/uL (ref 1.7–7.7)
Neutrophils Relative %: 44 %
Platelets: 268 10*3/uL (ref 150–400)
RBC: 4.47 MIL/uL (ref 3.87–5.11)
RDW: 12.4 % (ref 11.5–15.5)
WBC: 4.1 10*3/uL (ref 4.0–10.5)
nRBC: 0 % (ref 0.0–0.2)

## 2018-11-24 LAB — COMPREHENSIVE METABOLIC PANEL
ALT: 17 U/L (ref 0–44)
AST: 18 U/L (ref 15–41)
Albumin: 3.8 g/dL (ref 3.5–5.0)
Alkaline Phosphatase: 79 U/L (ref 38–126)
Anion gap: 10 (ref 5–15)
BUN: 10 mg/dL (ref 8–23)
CO2: 25 mmol/L (ref 22–32)
Calcium: 9.2 mg/dL (ref 8.9–10.3)
Chloride: 103 mmol/L (ref 98–111)
Creatinine, Ser: 1 mg/dL (ref 0.44–1.00)
GFR calc Af Amer: >60 mL/min (ref >60)
GFR calc non Af Amer: >60 mL/min (ref >60)
Glucose, Bld: 110 mg/dL — ABNORMAL HIGH (ref 70–99)
Potassium: 3.7 mmol/L (ref 3.5–5.1)
Sodium: 138 mmol/L (ref 135–145)
Total Bilirubin: 0.5 mg/dL (ref 0.3–1.2)
Total Protein: 7 g/dL (ref 6.5–8.1)

## 2018-11-24 LAB — BRAIN NATRIURETIC PEPTIDE: B Natriuretic Peptide: 17.2 pg/mL (ref 0.0–100.0)

## 2018-11-24 LAB — TROPONIN I: Troponin I: 0.1 ng/mL (ref <0.03)

## 2018-11-24 LAB — D-DIMER, QUANTITATIVE (NOT AT ARMC): D-Dimer, Quant: 0.45 ug/mL-FEU (ref 0.00–0.50)

## 2018-11-24 SURGERY — LEFT HEART CATH AND CORONARY ANGIOGRAPHY
Anesthesia: LOCAL

## 2018-11-24 MED ORDER — VERAPAMIL HCL 2.5 MG/ML IV SOLN
INTRAVENOUS | Status: DC | PRN
  Administered 2018-11-24: 14:00:00 via INTRA_ARTERIAL

## 2018-11-24 MED ORDER — HEPARIN BOLUS VIA INFUSION
4000.0000 [IU] | Freq: Once | INTRAVENOUS | Status: DC
  Filled 2018-11-24: qty 4000

## 2018-11-24 MED ORDER — LABETALOL HCL 5 MG/ML IV SOLN: 10.0000 mg | INTRAVENOUS | Status: DC | PRN

## 2018-11-24 MED ORDER — SODIUM CHLORIDE 0.9 % IV SOLN: 250.0000 mL | INTRAVENOUS | Status: DC | PRN

## 2018-11-24 MED ORDER — SODIUM CHLORIDE 0.9% FLUSH: 3.0000 mL | INTRAVENOUS | Status: DC | PRN

## 2018-11-24 MED ORDER — HEPARIN SODIUM (PORCINE) 1000 UNIT/ML IJ SOLN
INTRAMUSCULAR | Status: AC
  Filled 2018-11-24: qty 1

## 2018-11-24 MED ORDER — NITROGLYCERIN 0.4 MG SL SUBL: 0.4000 mg | SUBLINGUAL_TABLET | SUBLINGUAL | 3 refills | Status: DC | PRN

## 2018-11-24 MED ORDER — VERAPAMIL HCL 2.5 MG/ML IV SOLN
INTRAVENOUS | Status: AC
  Filled 2018-11-24: qty 2

## 2018-11-24 MED ORDER — MIDAZOLAM HCL 2 MG/2ML IJ SOLN
INTRAMUSCULAR | Status: AC
  Filled 2018-11-24: qty 2

## 2018-11-24 MED ORDER — HEPARIN (PORCINE) IN NACL 1000-0.9 UT/500ML-% IV SOLN
INTRAVENOUS | Status: AC
  Filled 2018-11-24: qty 1500

## 2018-11-24 MED ORDER — HEPARIN SODIUM (PORCINE) 1000 UNIT/ML IJ SOLN
INTRAMUSCULAR | Status: DC | PRN
  Administered 2018-11-24: 5000 [IU] via INTRAVENOUS

## 2018-11-24 MED ORDER — FENTANYL CITRATE (PF) 100 MCG/2ML IJ SOLN
INTRAMUSCULAR | Status: AC
  Filled 2018-11-24: qty 2

## 2018-11-24 MED ORDER — SODIUM CHLORIDE 0.9 % IV SOLN: INTRAVENOUS | Status: AC

## 2018-11-24 MED ORDER — MIDAZOLAM HCL 2 MG/2ML IJ SOLN
INTRAMUSCULAR | Status: DC | PRN
  Administered 2018-11-24 (×2): 1 mg via INTRAVENOUS

## 2018-11-24 MED ORDER — LIDOCAINE HCL (PF) 1 % IJ SOLN
INTRAMUSCULAR | Status: AC
  Filled 2018-11-24: qty 30

## 2018-11-24 MED ORDER — IOHEXOL 350 MG/ML SOLN
INTRAVENOUS | Status: DC | PRN
  Administered 2018-11-24: 85 mL via INTRACARDIAC

## 2018-11-24 MED ORDER — FENTANYL CITRATE (PF) 100 MCG/2ML IJ SOLN
INTRAMUSCULAR | Status: DC | PRN
  Administered 2018-11-24 (×2): 25 ug via INTRAVENOUS

## 2018-11-24 MED ORDER — FENTANYL CITRATE (PF) 100 MCG/2ML IJ SOLN
50.0000 ug | INTRAMUSCULAR | Status: DC | PRN
  Filled 2018-11-24: qty 2

## 2018-11-24 MED ORDER — ATORVASTATIN CALCIUM 40 MG PO TABS: 40.0000 mg | ORAL_TABLET | Freq: Every day | ORAL | 2 refills | Status: DC

## 2018-11-24 MED ORDER — HYDRALAZINE HCL 20 MG/ML IJ SOLN: 10.0000 mg | INTRAMUSCULAR | Status: DC | PRN

## 2018-11-24 MED ORDER — SODIUM CHLORIDE 0.9% FLUSH: 3.0000 mL | Freq: Two times a day (BID) | INTRAVENOUS | Status: DC

## 2018-11-24 MED ORDER — HEPARIN (PORCINE) 25000 UT/250ML-% IV SOLN: 1000.0000 [IU]/h | INTRAVENOUS | Status: DC

## 2018-11-24 MED ORDER — HEPARIN (PORCINE) IN NACL 1000-0.9 UT/500ML-% IV SOLN
INTRAVENOUS | Status: DC | PRN
  Administered 2018-11-24 (×2): 500 mL

## 2018-11-24 MED ORDER — LIDOCAINE HCL (PF) 1 % IJ SOLN
INTRAMUSCULAR | Status: DC | PRN
  Administered 2018-11-24: 2 mL

## 2018-11-24 MED ORDER — ASPIRIN EC 81 MG PO TBEC: 81.0000 mg | DELAYED_RELEASE_TABLET | Freq: Every day | ORAL | 11 refills | Status: AC

## 2018-11-24 MED FILL — NITROGLYCERIN 0.4 MG TAB SL: 0.4 | 30 days supply | Qty: 100 | Fill #0 | Status: TO

## 2018-11-24 MED FILL — ATORVASTATIN CALCIUM 40 MG: 40 | 30 days supply | Qty: 30 | Fill #0 | Status: TO

## 2018-11-24 SURGICAL SUPPLY — 10 items
CATH 5FR JL3.5 JR4 ANG PIG MP (CATHETERS) ×2 IMPLANT
DEVICE RAD COMP TR BAND LRG (VASCULAR PRODUCTS) ×2 IMPLANT
GLIDESHEATH SLEND SS 6F .021 (SHEATH) ×2 IMPLANT
GUIDEWIRE INQWIRE 1.5J.035X260 (WIRE) ×1 IMPLANT
INQWIRE 1.5J .035X260CM (WIRE) ×2
KIT HEART LEFT (KITS) ×2 IMPLANT
PACK CARDIAC CATHETERIZATION (CUSTOM PROCEDURE TRAY) ×2 IMPLANT
SYR MEDRAD MARK 7 150ML (SYRINGE) ×2 IMPLANT
TRANSDUCER W/STOPCOCK (MISCELLANEOUS) ×2 IMPLANT
TUBING CIL FLEX 10 FLL-RA (TUBING) ×2 IMPLANT

## 2018-11-24 NOTE — Progress Notes (Signed)
ANTICOAGULATION CONSULT NOTE - Initial Consult  Pharmacy Consult for heparin Indication: chest pain/ACS  No Known Allergies  Patient Measurements: Height: 5' 4.5" (163.8 cm) Weight: 250 lb (113.4 kg) IBW/kg (Calculated) : 55.85 Heparin Dosing Weight: 82.9 kg  Vital Signs: Temp: 98 F (36.7 C) (04/30 0928) Temp Source: Oral (04/30 0928) BP: 155/91 (04/30 0928) Pulse Rate: 84 (04/30 0928)  Labs: Recent Labs    11/24/18 0954  HGB 12.4  HCT 39.6  PLT 268  CREATININE 1.00  TROPONINI 0.10*    Estimated Creatinine Clearance: 72.7 mL/min (by C-G formula based on SCr of 1 mg/dL).   Medical History: Past Medical History:  Diagnosis Date  . Arthritis     Assessment: 34 yof presenting with CP/SOB. No AC PTA.   Hgb 12.4, plt 268. Trop 0.1. No s/sx of bleeding.   Goal of Therapy:  Heparin level 0.3-0.7 units/ml Monitor platelets by anticoagulation protocol: Yes   Plan:  Give 4000 units bolus x 1 Start heparin infusion at 1000 units/hr Check anti-Xa level in 6 hours and daily while on heparin Continue to monitor H&H and platelets  Antonietta Jewel, PharmD, BCCCP Clinical Pharmacist  Pager: [phone] Phone: [phone] 11/24/2018,12:03 PM
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
## 2018-11-24 NOTE — Discharge Summary (Addendum)
Discharge Summary    Patient ID: Margaret Stark MRN: [id number]; DOB: 05-Mar-1956  Admit date: 11/24/2018 Discharge date: 11/24/2018  Primary Care Provider: Leone Haven, MD  Primary Cardiologist: New to [facility] (Dr. Marlou Stark) Primary Electrophysiologist:  None   Discharge Diagnoses    Principal Problem:   Chest pain Active Problems:   Elevated troponin   Mild Non-Obstructive CAD    Allergies No Known Allergies  Diagnostic Studies/Procedures    Cardiac Catheterization 11/24/2018:  Prox LAD lesion is 30% stenosed.  Prox RCA to Mid RCA lesion is 10% stenosed.  The left ventricular systolic function is normal.  LV end diastolic pressure is normal.  The left ventricular ejection fraction is greater than 65% by visual estimate.  There is no mitral valve regurgitation.   1. Mild non-obstructive disease in the mid LAD and proximal RCA 2. No evidence of coronary artery spasm or SCAD (spontaneous coronary artery dissection) 3. Normal LV systolic function  Recommendations: No further ischemic workup. Medical management of CAD with ASA and statin.   History of Present Illness     [name] is a 63 year old female with a history of arthritis and obesity but no known cardiac diease who presented to the ED today for evaluation of chest pain. Patient has never seen a Cardiologist or had any type of cardiac history.   Patient reports sudden onset mild shortness of breath and a feeling like she could not catch her breath at rest this morning around 7:30am. Patient then developed some substernal non-radiating chest heaviness that she ranks as a 5/10 on the pain scale. She also developed palpitations and felt like she was going to pass out but denies any syncope. No associated diaphoresis, nausea, or vomiting. Patient states she has had panic attacks before which have felt similar but she has never had chest pain with these and she is usually able to calm herself down by taking  slow deep breath. Patient does reports some occasional chest with exertion when walking up steps which she has noticed over the last year. She does not feel like this is getting worse or more frequent. She denies any orthopnea, PND, or edema. No recent fever, chills, body aches, or respiratory symptoms. Patient was worried when she started developing chest pain so she called 911. EMS gave patient Aspirin but no Nitroglycerin.   In the ED, patient hypertensive but vitals stable. Currently unable to review EKG showed some mild T wave inversions in the inferior leads. Initial troponin elevated at 0.10. Chest x-ray showed no acute findings. D-dimer negative. BNP normal. WBC 4.1, Hgb 12.4, Plts 268. Na 138, K 3.7, Glucose 110, SCr 1.0. Patient states chest pain was constant for about 2 hours and then resolved. She is currently chest pain free and her breathing has improved. Cardiology was asked to admit patient given elevated troponin.   Of note, patient has no known history of hypertension, hyperlipidemia, or diabetes and no history of tobacco use. She takes no medications at home. She has no known family history of heart disease.   Hospital Course     Consultants: None.  Chest Pain with Elevated Troponin - Given minimally elevated troponin and T wave inversions on the EKG, the decision was made to proceed with cardiac catheterization which showed mild non-obstructive CAD with 30% stenosis of the proximal LAD and 10% stenosis of the proximal to mid RCA. No evidence of coronary artery spasm or SCAD. Low suspicion for pulmonary embolism given negative D-dimer. Patient  tolerated the procedure well and was felt to be stable for discharge on the same day. Will start Aspirin 81mg  daily and Lipitor 40mg  daily given lesion in proximal LAD. Recommend continued risk factor modification of hypertension, hyperlipidemia, diabetes, and obesity. Patient's BP has been mildly elevated here with systolic BP in the 124'P to  150's at times. Although most recent BP 134/78. Will defer monitoring and possible initiation of antihypertensives to PCP. Will need lipid panel and CMP in 6 weeks after starting high-intensity statin.  Patient seen and examined by Dr. Marlou Stark following cardiac catheterization and felt to be stable for discharge. Patient to follow-up with PCP (Dr. Caryl Stark). Can follow-up with Cardiology on an as needed basis. Medications as below.  _____________  Discharge Vitals Blood pressure 134/78, pulse 79, temperature 98 F (36.7 C), temperature source Oral, resp. rate 15, height 5' 4.5" (1.638 m), weight 113.4 kg, SpO2 99 %.  Filed Weights   11/24/18 0929  Weight: 113.4 kg    Labs & Radiologic Studies    CBC Recent Labs    11/24/18 0954  WBC 4.1  NEUTROABS 1.9  HGB 12.4  HCT 39.6  MCV 88.6  PLT 809   Basic Metabolic Panel Recent Labs    11/24/18 0954  NA 138  K 3.7  CL 103  CO2 25  GLUCOSE 110*  BUN 10  CREATININE 1.00  CALCIUM 9.2   Liver Function Tests Recent Labs    11/24/18 0954  AST 18  ALT 17  ALKPHOS 79  BILITOT 0.5  PROT 7.0  ALBUMIN 3.8   No results for input(s): LIPASE, AMYLASE in the last 72 hours. Cardiac Enzymes Recent Labs    11/24/18 0954  TROPONINI 0.10*   BNP Invalid input(s): POCBNP D-Dimer Recent Labs    11/24/18 0954  DDIMER 0.45   Hemoglobin A1C No results for input(s): HGBA1C in the last 72 hours. Fasting Lipid Panel No results for input(s): CHOL, HDL, LDLCALC, TRIG, CHOLHDL, LDLDIRECT in the last 72 hours. Thyroid Function Tests No results for input(s): TSH, T4TOTAL, T3FREE, THYROIDAB in the last 72 hours.  Invalid input(s): FREET3 _____________  Dg Chest 2 View  Result Date: 11/24/2018 CLINICAL DATA:  Chest pain and dizziness EXAM: CHEST - 2 VIEW COMPARISON:  None. FINDINGS: Lungs are clear. Heart size and pulmonary vascularity are normal. No adenopathy. There is degenerative change in the thoracic spine. IMPRESSION: No edema  or consolidation. Electronically Signed   By: Margaret Stark M.D.   On: 11/24/2018 10:07   Disposition   Patient is being discharged home today in good condition.  Follow-up Plans & Appointments    Follow-up Information    Margaret Haven, MD. Schedule an appointment as soon as possible for a visit.   Specialty:  Family Medicine Why:  Please call your primary care physician's office and schedule a follow-up visit as soon as possible (preferably within the next 1-2 weeks). Contact information: [address] [place] [address] [phone]          Discharge Instructions    Diet - low sodium heart healthy   Complete by:  As directed    Increase activity slowly   Complete by:  As directed       Discharge Medications   Allergies as of 11/24/2018   No Known Allergies     Medication List    STOP taking these medications   diclofenac sodium 1 % Gel Commonly known as:  VOLTAREN   Diclofenac Sodium  2 % Soln Commonly known as:  Pennsaid   latanoprost 0.005 % ophthalmic solution Commonly known as:  XALATAN     TAKE these medications   aspirin EC 81 MG tablet Take 1 tablet (81 mg total) by mouth daily.   atorvastatin 40 MG tablet Commonly known as:  Lipitor Take 1 tablet (40 mg total) by mouth daily.   nitroGLYCERIN 0.4 MG SL tablet Commonly known as:  Nitrostat Place 1 tablet (0.4 mg total) under the tongue every 5 (five) minutes as needed for chest pain.        Acute coronary syndrome (MI, NSTEMI, STEMI, etc) this admission?: No.    Outstanding Labs/Studies   Will need lipid panel and CMP in 6 weeks after starting high-intensity statin.  Duration of Discharge Encounter   Greater than 30 minutes including physician time.  Signed, Darreld Mclean, PA-C 11/24/2018, 2:53 PM   Personally seen and examined. Agree with above.   Cardiac catheterization performed.  Minor 30% LAD mid plaque. Normal ejection fraction. No aortic  stenosis.  D-dimer normal.  Overall reassuring.  Radial access.  After she is recovered from cardiac catheterization over the next 2 hours or so, she may be discharged home.  Given her mild LAD plaque, we will start atorvastatin 40 mg once a day, high intensity statin.  I have also asked her to take a low-dose aspirin, 81 mg.  I would feel comfortable with her following up with her primary care physician Dr. Caryl Stark.  Discussed this with her.  Candee Furbish, MD

## 2018-11-24 NOTE — ED Notes (Signed)
Pt ambulatory with no reported issues.

## 2018-11-24 NOTE — ED Notes (Signed)
This RN acting in a nurse navigator role and spoke with pt about contacting her husband. Pt asked for this RN to contact her husband and let him know he could go to work, and she will call him later. I spoke with her husband and he was very thankful for the update.

## 2018-11-24 NOTE — ED Triage Notes (Signed)
To ED via [facility] from home - c/o chest pressure - started this am, initially had some shortness of breath-- on arrival pain is 3/10 , midsternal-

## 2018-11-24 NOTE — ED Notes (Signed)
Currently denies pain and does not want pain medication.

## 2018-11-24 NOTE — ED Notes (Signed)
Cardiology at bedside.

## 2018-11-24 NOTE — Interval H&P Note (Signed)
History and Physical Interval Note:  11/24/2018 1:43 PM  Margaret Stark  has presented today for cardiac cath with the diagnosis of NSTEMI. The various methods of treatment have been discussed with the patient and family. After consideration of risks, benefits and other options for treatment, the patient has consented to  Procedure(s): LEFT HEART CATH AND CORONARY ANGIOGRAPHY (N/A) as a surgical intervention.  The patient's history has been reviewed, patient examined, no change in status, stable for surgery.  I have reviewed the patient's chart and labs.  Questions were answered to the patient's satisfaction.    Cath Lab Visit (complete for each Cath Lab visit)  Clinical Evaluation Leading to the Procedure:   ACS: Yes.    Non-ACS:    Anginal Classification: CCS III  Anti-ischemic medical therapy: No Therapy  Non-Invasive Test Results: No non-invasive testing performed  Prior CABG: No previous CABG         Lauree Chandler

## 2018-11-24 NOTE — Progress Notes (Signed)
   Cardiac catheterization performed.  Minor 30% LAD mid plaque. Normal ejection fraction. No aortic stenosis.  D-dimer normal.  Overall reassuring.  Radial access.  After she is recovered from cardiac catheterization over the next 2 hours or so, she may be discharged home.  Given her mild LAD plaque, we will start atorvastatin 40 mg once a day, high intensity statin.  I have also asked her to take a low-dose aspirin, 81 mg.  I would feel comfortable with her following up with her primary care physician Dr. Caryl Bis.  Discussed this with her.  Candee Furbish, MD

## 2018-11-24 NOTE — H&P (Addendum)
Cardiology Admission History and Physical:   Margaret Stark ID: Margaret Stark MRN: [id number]; DOB: 11-02-1955   Admission date: 11/24/2018  Primary Care Provider: Leone Haven, MD Primary Cardiologist: New to [facility] (Dr. Marlou Porch) Primary Electrophysiologist:  None   Chief Complaint: Chest pain  Margaret Stark Profile:   Margaret Stark is a 63 y.o. female with a history of arthritis and obesity but no known cardiac diease who presented to the ED today for evaluation of chest pain.  History of Present Illness:   Margaret Stark is a 63 year old female with a history of arthritis and obesity but no known cardiac diease who presented to the ED today for evaluation of chest pain. Margaret Stark has never seen a Cardiologist or had any type of cardiac history.   Margaret Stark reports sudden onset mild shortness of breath and a feeling like Margaret Stark could not catch Margaret Stark breath at rest this morning around 7:30am. Margaret Stark then developed some substernal non-radiating chest heaviness that Margaret Stark ranks as a 5/10 on the pain scale. Margaret Stark also developed palpitations and felt like Margaret Stark was going to pass out but denies any syncope. No associated diaphoresis, nausea, or vomiting. Margaret Stark states Margaret Stark has had panic attacks before which have felt similar but Margaret Stark has never had chest pain with these and Margaret Stark is usually able to calm Margaret Stark down by taking slow deep breath. Margaret Stark does reports some occasional chest with exertion when walking up steps which Margaret Stark has noticed over the last year. Margaret Stark does not feel like this is getting worse or more frequent. Margaret Stark denies any orthopnea, PND, or edema. No recent fever, chills, body aches, or respiratory symptoms. Margaret Stark was worried when Margaret Stark started developing chest pain so Margaret Stark called 911. EMS gave Margaret Stark Aspirin but no Nitroglycerin.   In the ED, Margaret Stark hypertensive but vitals stable. Currently unable to review EKG remotely but per ED note showed no acute STEMI. Initial troponin elevated at 0.10. Chest  x-ray showed no acute findings. D-dimer negative. BNP normal. WBC 4.1, Hgb 12.4, Plts 268. Na 138, K 3.7, Glucose 110, SCr 1.0. Margaret Stark states chest pain was constant for about 2 hours and then resolved. Margaret Stark is currently chest pain free and Margaret Stark breathing has improved. Cardiology was asked to admit Margaret Stark given elevated troponin.   Of note, Margaret Stark has no known history of hypertension, hyperlipidemia, or diabetes and no history of tobacco use. Margaret Stark takes no medications at home. Margaret Stark has no known family history of heart disease.    Past Medical History:  Diagnosis Date  . Arthritis     Past Surgical History:  Procedure Laterality Date  . COLONOSCOPY WITH PROPOFOL N/A 07/15/2015   Procedure: COLONOSCOPY WITH PROPOFOL;  Surgeon: Hulen Luster, MD;  Location: [facility] ENDOSCOPY;  Service: Gastroenterology;  Laterality: N/A;     Medications Prior to Admission: Prior to Admission medications   Medication Sig Start Date End Date Taking? Authorizing Provider  Diclofenac Sodium (PENNSAID) 2 % SOLN Place 2 application onto the skin 2 (two) times daily. 11/11/18   Leone Haven, MD  diclofenac sodium (VOLTAREN) 1 % GEL Apply 4 g topically 4 (four) times daily as needed (mild pain). 11/12/18   Leone Haven, MD  latanoprost (XALATAN) 0.005 % ophthalmic solution INSTILL 1 DROP IN [facility] EYE AT BEDTIME 02/03/16   [provider]     Allergies:   No Known Allergies  Social History:   Social History   Socioeconomic History  . Marital status: Married  Spouse name: Not on file  . Number of children: Not on file  . Years of education: Not on file  . Highest education level: Not on file  Occupational History  . Not on file  Social Needs  . Financial resource strain: Not on file  . Food insecurity:    Worry: Not on file    Inability: Not on file  . Transportation needs:    Medical: Not on file    Non-medical: Not on file  Tobacco Use  . Smoking status: Never Smoker  . Smokeless  tobacco: Never Used  Substance and Sexual Activity  . Alcohol use: Yes    Alcohol/week: 0.0 standard drinks  . Drug use: No  . Sexual activity: Not on file  Lifestyle  . Physical activity:    Days per week: Not on file    Minutes per session: Not on file  . Stress: Not on file  Relationships  . Social connections:    Talks on phone: Not on file    Gets together: Not on file    Attends religious service: Not on file    Active member of club or organization: Not on file    Attends meetings of clubs or organizations: Not on file    Relationship status: Not on file  . Intimate partner violence:    Fear of current or ex partner: Not on file    Emotionally abused: Not on file    Physically abused: Not on file    Forced sexual activity: Not on file  Other Topics Concern  . Not on file  Social History Narrative  . Not on file    Family History:   The Margaret Stark's family history includes Cancer in Margaret Stark sister; Hypertension in Margaret Stark father and mother. There is no history of Heart disease.    ROS:  Please see the history of present illness.  Review of Systems  Constitutional: Negative for chills, diaphoresis and fever.  HENT: Negative for congestion and sore throat.   Respiratory: Positive for shortness of breath. Negative for cough, hemoptysis and sputum production.   Cardiovascular: Positive for chest pain and palpitations. Negative for orthopnea, leg swelling and PND.  Gastrointestinal: Negative for blood in stool, nausea and vomiting.  Genitourinary: Negative for hematuria.  Musculoskeletal: Negative for myalgias.  Neurological: Negative for dizziness and loss of consciousness.  Endo/Heme/Allergies: Does not bruise/bleed easily.  Psychiatric/Behavioral: Negative for substance abuse.  All other systems reviewed and are negative.   Physical Exam/Data:   Vitals:   11/24/18 0923 11/24/18 0928 11/24/18 0929  BP:  (!) 155/91   Pulse:  84   Resp:  15   Temp:  98 F (36.7 C)    TempSrc:  Oral   SpO2: 100% 100%   Weight:   113.4 kg  Height:   5' 4.5" (1.638 m)   No intake or output data in the 24 hours ending 11/24/18 1130 Last 3 Weights 11/24/2018 10/26/2016 09/21/2016  Weight (lbs) 250 lb 252 lb 2 oz 256 lb  Weight (kg) 113.399 kg 114.363 kg 116.121 kg     Body mass index is 42.25 kg/m.   Physical Exam per MD:  General:  Well nourished, well developed, in no acute distress. HEENT: Normal Lymph: No adenopathy. Neck: Supple. No JVD. Endocrine:  No thyromegaly. Vascular: No carotid bruits; FA pulses 2+ bilaterally without bruits. Cardiac: RRR. Distinct S1 and S2. No murmurs, gallops, or rubs. Lungs: No increased work of breathing. Clear to auscultation bilaterally. No  wheezes, rhonchi, or rales. Abd: Soft, non-distended, and non-tender. Bowel sounds present. Ext: No lower extremity edema. Musculoskeletal:  No deformities, BUE and BLE strength normal and equal. Skin: Warm and dry.  Neuro:  Alert and oriented x3. No focal deficits. Psych:  Normal affect. Responds appropriately.   EKG:  The ECG that was done was personally reviewed and demonstrates sinus rhythm with mildly elevated T wave inversions noted in the inferior leads as well as J-point elevation noted in V2  Relevant CV Studies: None.  Laboratory Data:  Chemistry Recent Labs  Lab 11/24/18 0954  NA 138  K 3.7  CL 103  CO2 25  GLUCOSE 110*  BUN 10  CREATININE 1.00  CALCIUM 9.2  GFRNONAA >60  GFRAA >60  ANIONGAP 10    Recent Labs  Lab 11/24/18 0954  PROT 7.0  ALBUMIN 3.8  AST 18  ALT 17  ALKPHOS 79  BILITOT 0.5   Hematology Recent Labs  Lab 11/24/18 0954  WBC 4.1  RBC 4.47  HGB 12.4  HCT 39.6  MCV 88.6  MCH 27.7  MCHC 31.3  RDW 12.4  PLT 268   Cardiac Enzymes Recent Labs  Lab 11/24/18 0954  TROPONINI 0.10*   No results for input(s): TROPIPOC in the last 168 hours.  BNP Recent Labs  Lab 11/24/18 0958  BNP 17.2    DDimer  Recent Labs  Lab 11/24/18  0954  DDIMER 0.45    Radiology/Studies:  Dg Chest 2 View  Result Date: 11/24/2018 CLINICAL DATA:  Chest pain and dizziness EXAM: CHEST - 2 VIEW COMPARISON:  None. FINDINGS: Lungs are clear. Heart size and pulmonary vascularity are normal. No adenopathy. There is degenerative change in the thoracic spine. IMPRESSION: No edema or consolidation. Electronically Signed   By: Lowella Grip III M.D.   On: 11/24/2018 10:07    Assessment and Plan:   NSTEMI - Margaret Stark had sudden onset of shortness of breath this morning at rest and states Margaret Stark felt like Margaret Stark could not breathe. Initially, felt similar to previous panic attacks but then Margaret Stark developed substernal non-radiating chest heaviness. Margaret Stark also reports some chest pain with exertion when walking up steps quickly over the last year. - EKG shows some mild inferior T wave inversion. - Initial troponin elevated at 0.10. Continue to trend. - BNP normal. - D-dimer negative. - Will check lipid panel and hemoglobin A1c. - Margaret Stark is currently chest pain free.  - Will start IV Heparin. - Will start daily Aspirin, beta-blocker, and statin. - Presentation sounds like it could have been a panic attack. However, initial troponin minimally elevated and there are some T wave inversions on EKG. Cardiovascular risk factor include obesity and more sedentary lifestyle but no known hypertension, hyperlipidemia, diabetes, tobacco use, or family history of heart disease. Need to rule out ACS. Discussed with MD - will proceed with cardiac catheterization.   Severity of Illness: The appropriate Margaret Stark status for this Margaret Stark is OBSERVATION. Observation status is judged to be reasonable and necessary in order to provide the required intensity of service to ensure the Margaret Stark's safety. The Margaret Stark's presenting symptoms, physical exam findings, and initial radiographic and laboratory data in the context of their medical condition is felt to place them at decreased  risk for further clinical deterioration. Furthermore, it is anticipated that the Margaret Stark will be medically stable for discharge from the hospital within 2 midnights of admission. The following factors support the Margaret Stark status of observation.   " The Margaret Stark's presenting symptoms  include chest pain and shortness of breath. " The physical exam findings as above. " The initial radiographic and laboratory data are elevated troponin.     For questions or updates, please contact [place] Please consult [email or web address] for contact info under        Signed, Darreld Mclean, PA-C  11/24/2018 11:30 AM   Personally seen and examined. Agree with above.  63 year old with no prior cardiac history who came in today with chest pressure following what Margaret Stark thought may have been anxiety.  Margaret Stark has had panic attacks in the past but has not had accompanying pressure like sensation.  Mild shortness of breath as well.  EKG via EMS appeared fairly normal.  Repeat EKG does show subtle T wave inversion in the inferior leads.  Troponin was drawn and was elevated at 0.1.  Margaret Stark is currently feeling better comfortable.  GEN: Well nourished, well developed, in no acute distress, obese  HEENT: normal  Neck: no JVD, carotid bruits, or masses Cardiac: RRR; no murmurs, rubs, or gallops,no edema  Respiratory:  clear to auscultation bilaterally, normal work of breathing GI: soft, nontender, nondistended, + BS MS: no deformity or atrophy  Skin: warm and dry, no rash Neuro:  Alert and Oriented x 3, Strength and sensation are intact Psych: euthymic mood, full affect  Assessment and plan:  Chest pain/non-STEMI with mildly elevated troponin, mildly abnormal EKG with subtle inferior T wave inversions.  Morbid obesity.  Nondiabetic, non-smoker, no history of hypertension or hyperlipidemia.  No family history.  - Discussed Margaret Stark case at length with Margaret Stark.  Given Margaret Stark elevated troponin in the setting of chest pressure  earlier today, we will go ahead and proceed with cardiac catheterization.  Risks and benefits of procedure including stroke heart attack death renal impairment bleeding of been discussed.  Margaret Stark is willing to proceed.  I also called Margaret Stark husband to discuss. - Margaret Stark has not had any sick contacts, no fevers as it relates to COVID-19.  Candee Furbish, MD

## 2018-11-24 NOTE — Discharge Instructions (Signed)
Recommendations: - Start Atorvastatin (Lipitor) 40mg  daily. - Start over-the-counter Aspirin 81mg  daily. - I discontinued the Voltaren gel, Pennsaid solution, and Xalatan eye drops at discharge because you reported you were not taking any medications at home. If you are still taking these, it is OK to take them as directed by the medical provider who prescribed them.    Please follow-up with your primary care physician in the next 1-2 weeks.   Post Cardiac Catheterization: NO HEAVY LIFTING OR SEXUAL ACTIVITY X 7 DAYS. NO DRIVING X 2-3 DAYS. NO SOAKING BATHS, HOT TUBS, POOLS, ETC., X 7 DAYS.  Radial Site Care: This sheet gives you information about how to care for yourself after your procedure. Your health care provider may also give you more specific instructions. If you have problems or questions, contact your health care provider. What can I expect after the procedure? After the procedure, it is common to have: Bruising and tenderness at the catheter insertion area. Follow these instructions at home: Medicines Take over-the-counter and prescription medicines only as told by your health care provider. Insertion site care Follow instructions from your health care provider about how to take care of your insertion site. Make sure you: Wash your hands with soap and water before you change your bandage (dressing). If soap and water are not available, use hand sanitizer. Change your dressing as told by your health care provider. Leave stitches (sutures), skin glue, or adhesive strips in place. These skin closures may need to stay in place for 2 weeks or longer. If adhesive strip edges start to loosen and curl up, you may trim the loose edges. Do not remove adhesive strips completely unless your health care provider tells you to do that. Check your insertion site every day for signs of infection. Check for: Redness, swelling, or pain. Fluid or blood. Pus or a bad smell. Warmth. Do not take  baths, swim, or use a hot tub until your health care provider approves. You may shower 24-48 hours after the procedure, or as directed by your health care provider. Remove the dressing and gently wash the site with plain soap and water. Pat the area dry with a clean towel. Do not rub the site. That could cause bleeding. Do not apply powder or lotion to the site. Activity  For 24 hours after the procedure, or as directed by your health care provider: Do not flex or bend the affected arm. Do not push or pull heavy objects with the affected arm. Do not drive yourself home from the hospital or clinic. You may drive 24 hours after the procedure unless your health care provider tells you not to. Do not operate machinery or power tools. Do not lift anything that is heavier than 10 lb (4.5 kg), or the limit that you are told, until your health care provider says that it is safe. Ask your health care provider when it is okay to: Return to work or school. Resume usual physical activities or sports. Resume sexual activity. General instructions If the catheter site starts to bleed, raise your arm and put firm pressure on the site. If the bleeding does not stop, get help right away. This is a medical emergency. If you went home on the same day as your procedure, a responsible adult should be with you for the first 24 hours after you arrive home. Keep all follow-up visits as told by your health care provider. This is important. Contact a health care provider if: You have a fever.  You have redness, swelling, or yellow drainage around your insertion site. Get help right away if: You have unusual pain at the radial site. The catheter insertion area swells very fast. The insertion area is bleeding, and the bleeding does not stop when you hold steady pressure on the area. Your arm or hand becomes pale, cool, tingly, or numb. These symptoms may represent a serious problem that is an emergency. Do not wait  to see if the symptoms will go away. Get medical help right away. Call your local emergency services (911 in the [place] Do not drive yourself to the hospital. Summary After the procedure, it is common to have bruising and tenderness at the site. Follow instructions from your health care provider about how to take care of your radial site wound. Check the wound every day for signs of infection. Do not lift anything that is heavier than 10 lb (4.5 kg), or the limit that you are told, until your health care provider says that it is safe. This information is not intended to replace advice given to you by your health care provider. Make sure you discuss any questions you have with your health care provider. Document Released: 08/15/2010 Document Revised: 08/18/2017 Document Reviewed: 08/18/2017 Elsevier Interactive Patient Education  2019 [employer].

## 2018-11-24 NOTE — ED Notes (Addendum)
Lab called critical lab value troponin 0.10 reported to ED doctor.

## 2018-11-24 NOTE — ED Notes (Signed)
This RN spoke with pt's husband again to ensure he had been informed that pt was going for cardiac cath. Pt's husband spoke with Dr. Marlou Porch and felt well informed. Pt and pt's husband very appreciative of the communication. Pt to go for cath at 1pm.

## 2018-11-25 ENCOUNTER — Encounter (HOSPITAL_COMMUNITY): Payer: Self-pay | Admitting: Cardiovascular Disease

## 2018-11-30 MED FILL — Heparin Sod (Porcine)-NaCl IV Soln 1000 Unit/500ML-0.9%: INTRAVENOUS | Qty: 500 | Status: AC

## 2019-01-03 ENCOUNTER — Other Ambulatory Visit: Payer: Self-pay

## 2019-01-03 ENCOUNTER — Other Ambulatory Visit (INDEPENDENT_AMBULATORY_CARE_PROVIDER_SITE_OTHER): Payer: BC Managed Care – PPO

## 2019-01-03 DIAGNOSIS — R7303 Prediabetes: Secondary | ICD-10-CM | POA: Diagnosis not present

## 2019-01-03 DIAGNOSIS — Z1321 Encounter for screening for nutritional disorder: Secondary | ICD-10-CM

## 2019-01-03 LAB — COMPREHENSIVE METABOLIC PANEL
ALT: 13 U/L (ref 0–35)
AST: 11 U/L (ref 0–37)
Albumin: 4.3 g/dL (ref 3.5–5.2)
Alkaline Phosphatase: 94 U/L (ref 39–117)
BUN: 11 mg/dL (ref 6–23)
CO2: 26 mEq/L (ref 19–32)
Calcium: 9.8 mg/dL (ref 8.4–10.5)
Chloride: 105 mEq/L (ref 96–112)
Creatinine, Ser: 0.92 mg/dL (ref 0.40–1.20)
GFR: 74.72 mL/min (ref >60.00)
Glucose, Bld: 112 mg/dL — ABNORMAL HIGH (ref 70–99)
Potassium: 4.2 mEq/L (ref 3.5–5.1)
Sodium: 140 mEq/L (ref 135–145)
Total Bilirubin: 0.6 mg/dL (ref 0.2–1.2)
Total Protein: 6.8 g/dL (ref 6.0–8.3)

## 2019-01-03 LAB — LIPID PANEL
Cholesterol: 154 mg/dL (ref 0–200)
HDL: 41.1 mg/dL (ref >39.00)
LDL Cholesterol: 88 mg/dL (ref 0–99)
NonHDL: 113.33
Total CHOL/HDL Ratio: 4
Triglycerides: 126 mg/dL (ref 0.0–149.0)
VLDL: 25.2 mg/dL (ref 0.0–40.0)

## 2019-01-03 LAB — HEMOGLOBIN A1C: Hgb A1c MFr Bld: 7 % — ABNORMAL HIGH (ref 4.6–6.5)

## 2019-01-03 LAB — VITAMIN D 25 HYDROXY (VIT D DEFICIENCY, FRACTURES): VITD: 18.52 ng/mL — ABNORMAL LOW (ref 30.00–100.00)

## 2019-01-05 ENCOUNTER — Other Ambulatory Visit: Payer: Self-pay | Admitting: Family Medicine

## 2019-01-05 ENCOUNTER — Telehealth: Payer: Self-pay | Admitting: Family Medicine

## 2019-01-05 DIAGNOSIS — E559 Vitamin D deficiency, unspecified: Secondary | ICD-10-CM

## 2019-01-05 MED ORDER — VITAMIN D (ERGOCALCIFEROL) 1.25 MG (50000 UNIT) PO CAPS: 50000.0000 [IU] | ORAL_CAPSULE | ORAL | 0 refills | Status: DC

## 2019-01-05 NOTE — Telephone Encounter (Unsigned)
Copied from [place] [id number]. Topic: Quick Communication - Lab Results (Clinic Use ONLY) >> Jan 05, 2019 10:08 AM Yvette Rack wrote: Pt returned call for lab results. Pt requests call back. Cb# [phone]

## 2019-01-05 NOTE — Telephone Encounter (Signed)
Called pt. To discuss lab results; see result note.

## 2019-01-13 ENCOUNTER — Encounter: Payer: Self-pay | Admitting: Family Medicine

## 2019-01-23 LAB — HM COLONOSCOPY

## 2019-03-03 ENCOUNTER — Other Ambulatory Visit: Payer: BC Managed Care – PPO

## 2019-03-13 ENCOUNTER — Other Ambulatory Visit (INDEPENDENT_AMBULATORY_CARE_PROVIDER_SITE_OTHER): Payer: BC Managed Care – PPO

## 2019-03-13 ENCOUNTER — Other Ambulatory Visit: Payer: Self-pay

## 2019-03-13 ENCOUNTER — Telehealth: Payer: Self-pay

## 2019-03-13 DIAGNOSIS — E559 Vitamin D deficiency, unspecified: Secondary | ICD-10-CM | POA: Diagnosis not present

## 2019-03-13 NOTE — Telephone Encounter (Signed)
Letter was given to the patient who was in the lab at the time.  Alaylah Heatherington,cma

## 2019-03-13 NOTE — Telephone Encounter (Signed)
Letter created.  Please make available to patient.

## 2019-03-14 LAB — VITAMIN D 25 HYDROXY (VIT D DEFICIENCY, FRACTURES): VITD: 33.05 ng/mL (ref 30.00–100.00)

## 2019-03-15 ENCOUNTER — Ambulatory Visit: Payer: BC Managed Care – PPO | Admitting: Family Medicine

## 2019-03-15 ENCOUNTER — Ambulatory Visit (INDEPENDENT_AMBULATORY_CARE_PROVIDER_SITE_OTHER): Payer: BC Managed Care – PPO | Admitting: Family Medicine

## 2019-03-15 ENCOUNTER — Other Ambulatory Visit: Payer: Self-pay

## 2019-03-15 ENCOUNTER — Encounter: Payer: Self-pay | Admitting: Family Medicine

## 2019-03-15 DIAGNOSIS — E785 Hyperlipidemia, unspecified: Secondary | ICD-10-CM | POA: Diagnosis not present

## 2019-03-15 DIAGNOSIS — Z6841 Body Mass Index (BMI) 40.0 and over, adult: Secondary | ICD-10-CM

## 2019-03-15 DIAGNOSIS — I2510 Atherosclerotic heart disease of native coronary artery without angina pectoris: Secondary | ICD-10-CM | POA: Diagnosis not present

## 2019-03-15 DIAGNOSIS — E66813 Obesity, class 3: Secondary | ICD-10-CM

## 2019-03-15 DIAGNOSIS — R7303 Prediabetes: Secondary | ICD-10-CM | POA: Diagnosis not present

## 2019-03-15 NOTE — Assessment & Plan Note (Signed)
Continue Lipitor.  Check direct LDL with next labs.

## 2019-03-15 NOTE — Progress Notes (Signed)
Virtual Visit via video Note  This visit type was conducted due to national recommendations for restrictions regarding the COVID-19 pandemic (e.g. social distancing).  This format is felt to be most appropriate for this patient at this time.  All issues noted in this document were discussed and addressed.  No physical exam was performed (except for noted visual exam findings with Video Visits).   I connected with Margaret Stark today at  4:30 PM EDT by a video enabled telemedicine application and verified that I am speaking with the correct person using two identifiers. Location patient: home Location provider: work  Persons participating in the virtual visit: patient, provider  I discussed the limitations, risks, security and privacy concerns of performing an evaluation and management service by telephone and the availability of in person appointments. I also discussed with the patient that there may be a patient responsible charge related to this service. The patient expressed understanding and agreed to proceed.  Interactive audio and video telecommunications were attempted between this provider and patient, however failed, due to patient having technical difficulties OR patient did not have access to video capability.  We continued and completed visit with audio only.  Reason for visit: follow-up  HPI: HYPERLIPIDEMIA Symptoms Chest pain on exertion:  no   Medications: Compliance- taking lipitor Right upper quadrant pain- no  Muscle aches- no  Obesity: Patient was exercising though she stopped with COVID-19.  She does like to walk and wants to eventually get back into that.  She has made diet changes with cutting out junk food.  She has cut down on soda and sweet tea.  She gets vegetables every day.  Prediabetes: No polyuria or polydipsia.  She will be due for an A1c next month.  CAD: No chest pain.  She is taking aspirin.  No nitroglycerin use.  Vitamin D deficiency: Improved  with prescription supplementation.  She was not taking supplementation prior to that.   ROS: See pertinent positives and negatives per HPI.  Past Medical History:  Diagnosis Date  . Arthritis   . CAD (coronary artery disease)    a. LHC 11/24/2018: mild non-obstructive CAD with 30% stenosis of proximal LAD and 10% stenosis of  proximal to mid RCA    Past Surgical History:  Procedure Laterality Date  . COLONOSCOPY WITH PROPOFOL N/A 07/15/2015   Procedure: COLONOSCOPY WITH PROPOFOL;  Surgeon: Hulen Luster, MD;  Location: [facility] ENDOSCOPY;  Service: Gastroenterology;  Laterality: N/A;  . LEFT HEART CATH AND CORONARY ANGIOGRAPHY N/A 11/24/2018   Procedure: LEFT HEART CATH AND CORONARY ANGIOGRAPHY;  Surgeon: Burnell Blanks, MD;  Location: [facility];  Service: Cardiovascular;  Laterality: N/A;    Family History  Problem Relation Age of Onset  . Hypertension Mother   . Hypertension Father   . Cancer Sister        colon - age 77  . Heart disease Neg Hx     SOCIAL HX: nonsmoker   Current Outpatient Medications:  .  aspirin EC 81 MG tablet, Take 1 tablet (81 mg total) by mouth daily., Disp: 30 tablet, Rfl: 11 .  nitroGLYCERIN (NITROSTAT) 0.4 MG SL tablet, Place 1 tablet (0.4 mg total) under the tongue every 5 (five) minutes as needed for chest pain., Disp: 100 tablet, Rfl: 3 .  atorvastatin (LIPITOR) 40 MG tablet, Take 1 tablet (40 mg total) by mouth daily., Disp: 30 tablet, Rfl: 2  EXAM:  VITALS per patient if applicable: None.  GENERAL: alert,  oriented, appears well and in no acute distress  HEENT: atraumatic, conjunttiva clear, no obvious abnormalities on inspection of external nose and ears  NECK: normal movements of the head and neck  LUNGS: on inspection no signs of respiratory distress, breathing rate appears normal, no obvious gross SOB, gasping or wheezing  CV: no obvious cyanosis  MS: moves all visible extremities without noticeable abnormality   PSYCH/NEURO: pleasant and cooperative, no obvious depression or anxiety, speech and thought processing grossly intact  ASSESSMENT AND PLAN:  Discussed the following assessment and plan:  Mild Non-Obstructive CAD  No chest pain.  Discussed risk factor modifications.  She will continue baby aspirin.  Obesity Congratulated on dietary changes.  Discussed adding in walking for exercise 2 to 3 days a week to start and then increasing from there with a goal of at least 150 minutes of moderate exercise weekly.  Prediabetes Discussed diet and exercise for this issue.  She will start working on this.  She will come in in 1 month for labs.  Hyperlipidemia Continue Lipitor.  Check direct LDL with next labs.   Patient reports she gets her mammogram and Pap smear through gynecology.  I have asked her to have them send [name] the records when she has these done next month.   Social distancing precautions and sick precautions given regarding COVID-19.  I discussed the assessment and treatment plan with the patient. The patient was provided an opportunity to ask questions and all were answered. The patient agreed with the plan and demonstrated an understanding of the instructions.   The patient was advised to call back or seek an in-person evaluation if the symptoms worsen or if the condition fails to improve as anticipated.   [name], MD

## 2019-03-15 NOTE — Assessment & Plan Note (Signed)
No chest pain.  Discussed risk factor modifications.  She will continue baby aspirin.

## 2019-03-15 NOTE — Assessment & Plan Note (Signed)
Congratulated on dietary changes.  Discussed adding in walking for exercise 2 to 3 days a week to start and then increasing from there with a goal of at least 150 minutes of moderate exercise weekly.

## 2019-03-15 NOTE — Assessment & Plan Note (Signed)
Discussed diet and exercise for this issue.  She will start working on this.  She will come in in 1 month for labs.

## 2019-03-16 ENCOUNTER — Telehealth: Payer: Self-pay | Admitting: Family Medicine

## 2019-03-16 NOTE — Telephone Encounter (Signed)
I called pt and left vm to call to sch labs in 1 month.

## 2019-04-09 ENCOUNTER — Other Ambulatory Visit: Payer: Self-pay | Admitting: Student

## 2019-05-03 ENCOUNTER — Other Ambulatory Visit: Payer: Self-pay | Admitting: Student

## 2019-05-08 ENCOUNTER — Ambulatory Visit: Payer: BC Managed Care – PPO | Admitting: Family Medicine

## 2019-05-09 ENCOUNTER — Other Ambulatory Visit: Payer: Self-pay

## 2019-05-10 ENCOUNTER — Ambulatory Visit: Payer: BC Managed Care – PPO | Admitting: Family Medicine

## 2019-05-10 ENCOUNTER — Other Ambulatory Visit: Payer: Self-pay

## 2019-05-10 ENCOUNTER — Encounter: Payer: Self-pay | Admitting: Family Medicine

## 2019-05-10 DIAGNOSIS — F439 Reaction to severe stress, unspecified: Secondary | ICD-10-CM

## 2019-05-10 MED ORDER — SAXENDA 18 MG/3ML ~~LOC~~ SOPN: PEN_INJECTOR | SUBCUTANEOUS | 1 refills | Status: DC

## 2019-05-10 NOTE — Patient Instructions (Signed)
Nice to see you. We will try Saxenda for weight loss. Please try to add in exercise with walking.  Please continue with dietary changes.

## 2019-05-10 NOTE — Assessment & Plan Note (Signed)
Mild stress and anxiety related symptoms due to life events and stress at work.  Offered medication.  She declined this.  She will monitor for any worsening.

## 2019-05-10 NOTE — Progress Notes (Signed)
[name], MD Phone: [phone]  Margaret Stark is a 63 y.o. female who presents today for follow-up.  Obesity: Patient's weight is trended off.  Patient has cut down on sugar and soda intake.  She does stress eat some as she has a stressful job.  She has not been exercising very much.  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  She notes no family history or personal history of thyroid cancer, parathyroid cancer, or adrenal gland cancer.  She has previously tried phentermine and B12 and did lose about 40 pounds with a combination.  Stress/anxiety: She has occasional anxiety.  No depression.  She does have quite a bit of stress related to her job though her father was also sick and her sister had cancer treatment recently.  She declines medication for this at this time.  Social History   Tobacco Use  Smoking Status Never Smoker  Smokeless Tobacco Never Used     ROS see history of present illness  Objective  Physical Exam Vitals:   05/10/19 1617  BP: 120/80  Pulse: 77  Temp: 97.6 F (36.4 C)  SpO2: 99%    BP Readings from Last 3 Encounters:  05/10/19 120/80  11/24/18 121/84  10/26/16 122/76   Wt Readings from Last 3 Encounters:  05/10/19 253 lb 6.4 oz (114.9 kg)  03/15/19 250 lb (113.4 kg)  11/24/18 250 lb (113.4 kg)    Physical Exam Constitutional:      General: She is not in acute distress.    Appearance: She is not diaphoretic.  Cardiovascular:     Rate and Rhythm: Normal rate and regular rhythm.     Heart sounds: Normal heart sounds.  Pulmonary:     Effort: Pulmonary effort is normal.     Breath sounds: Normal breath sounds.  Musculoskeletal:     Right lower leg: No edema.     Left lower leg: No edema.  Skin:    General: Skin is warm and dry.  Neurological:     Mental Status: She is alert.      Assessment/Plan: Please see individual problem list.  Morbid obesity ([place]) This has  worsened.  Encouraged adding in exercise and continue with dietary changes.  Given difficulty in losing weight we will trial Saxenda.  She will monitor for any changes in her thyroid and let us know if those occur.  I did discuss at length that the risk of thyroid cancer and MEN2 cancers have been seen only in rats.  I discussed that I did not foresee any issues with those things in her given they have not been seen in humans though she will monitor for any changes in her thyroid.  She will monitor for nausea.  She will let us know if the medication is too expensive.  Stress at home Mild stress and anxiety related symptoms due to life events and stress at work.  Offered medication.  She declined this.  She will monitor for any worsening.   No orders of the defined types were placed in this encounter.   Meds ordered this encounter  Medications  . Liraglutide -Weight Management (SAXENDA) 18 MG/3ML SOPN    Sig: Inject 0.6 mg into the skin daily for 7 days, THEN 1.2 mg daily for 7 days, THEN 1.8 mg daily for 7 days, THEN 2.4 mg daily for 7 days, THEN 3 mg daily.    Dispense:  5 pen  Refill:  [place], MD [place]

## 2019-05-10 NOTE — Assessment & Plan Note (Addendum)
This has worsened.  Encouraged adding in exercise and continue with dietary changes.  Given difficulty in losing weight we will trial Saxenda.  She will monitor for any changes in her thyroid and let us know if those occur.  I did discuss at length that the risk of thyroid cancer and MEN2 cancers have been seen only in rats.  I discussed that I did not foresee any issues with those things in her given they have not been seen in humans though she will monitor for any changes in her thyroid.  She will monitor for nausea.  She will let us know if the medication is too expensive.

## 2019-05-12 ENCOUNTER — Telehealth: Payer: Self-pay | Admitting: Family Medicine

## 2019-05-12 NOTE — Telephone Encounter (Signed)
Patients husband was in the office today and reported the patient was dealing with a wrist cyst. Please see if she can do a visit to evaluate this. If she does not want to complete a visit then we could refer to orthopedics for evaluation. Thanks.

## 2019-05-15 ENCOUNTER — Telehealth: Payer: Self-pay

## 2019-05-15 NOTE — Telephone Encounter (Signed)
Copied from [place] [id number]. Topic: General - Other >> May 15, 2019  2:21 PM Rainey Pines A wrote: Patient stated that insurance company would not approve the Liraglutide -Weight Management ([place]) 18 MG/3ML SOPN . Patient would like to know if an alternative can be called in.  Patient would like a callback with alternative. Please advise

## 2019-05-16 NOTE — Telephone Encounter (Signed)
Called and left a voicemail informing the patient that the provider would like to schedule a visit with her to look at her wrist.  I left a callback number to schedule this.  Nina,cma

## 2019-05-17 ENCOUNTER — Telehealth: Payer: Self-pay

## 2019-05-17 NOTE — Telephone Encounter (Signed)
Called patient and spoke with spouse and informed him that her medication Kirke Shaggy is ready for pickup at the pharmacy and was approved.  Dru Primeau,cma

## 2019-05-17 NOTE — Telephone Encounter (Signed)
PA approval for Saxenda 18 mg/44ml [place] SOPN.  Gwyn Hieronymus,cma

## 2019-05-17 NOTE — Telephone Encounter (Signed)
aproved from 05/15/2019-09/15/2019.

## 2019-05-21 ENCOUNTER — Other Ambulatory Visit: Payer: Self-pay | Admitting: Family Medicine

## 2019-05-23 MED ORDER — INSULIN PEN NEEDLE 32G X 6 MM MISC: 1 refills | Status: DC

## 2019-05-23 NOTE — Addendum Note (Signed)
Addended by: Leone Haven on: 05/23/2019 05:14 PM   Modules accepted: Orders

## 2019-05-23 NOTE — Telephone Encounter (Signed)
Pt stated she would like to wait until her appt on 06/05/19 to see Dr. Caryl Bis.

## 2019-05-23 NOTE — Telephone Encounter (Signed)
Pt stated she needs Dr. Caryl Bis to send in rx for the needles needed for Saxenda. [facility]. Please advise.

## 2019-05-23 NOTE — Telephone Encounter (Signed)
Sent to pharmacy 

## 2019-05-23 NOTE — Telephone Encounter (Signed)
Pt stated she needs Dr. Caryl Bis to send in rx for the needles needed for Saxenda. [facility]. Please advise.  Koal Eslinger,cma

## 2019-05-23 NOTE — Telephone Encounter (Signed)
Noted  

## 2019-05-23 NOTE — Telephone Encounter (Signed)
Pt stated she would like to wait until her appt on 06/05/19 to see Dr. Caryl Bis for her wrist.  Erikah Thumm,cma

## 2019-06-02 ENCOUNTER — Other Ambulatory Visit: Payer: Self-pay

## 2019-06-05 ENCOUNTER — Ambulatory Visit: Payer: BC Managed Care – PPO | Admitting: Family Medicine

## 2019-06-05 ENCOUNTER — Encounter: Payer: Self-pay | Admitting: Family Medicine

## 2019-06-05 ENCOUNTER — Other Ambulatory Visit: Payer: Self-pay

## 2019-06-05 VITALS — BP 120/80 | HR 87 | Temp 96.5°F | Ht 64.0 in | Wt 247.6 lb

## 2019-06-05 DIAGNOSIS — Z23 Encounter for immunization: Secondary | ICD-10-CM | POA: Diagnosis not present

## 2019-06-05 DIAGNOSIS — M67432 Ganglion, left wrist: Secondary | ICD-10-CM | POA: Insufficient documentation

## 2019-06-05 NOTE — Assessment & Plan Note (Signed)
Weight has trended down. Discussed continuing diet and exercise. Advised on adding breakfast daily. She will continue saxenda and continue to taper up on this. She will contact us if she develops GI issues with the saxenda. She denies personal and family history of thyroid, parathyroid, and adrenal gland cancer. Advised of thyroid cancer issues seen in rat studies, though not in human studies. Discussed small risk of thyroid issues with this medication and if she develops any issues with thyroid changes she will let us know right away.

## 2019-06-05 NOTE — Assessment & Plan Note (Signed)
Likely ganglion cyst though given that it is difficult to tell if it is a bony process or not we will obtain an XRay. She will otherwise monitor for changes.

## 2019-06-05 NOTE — Patient Instructions (Signed)
Nice to see you. Please continue with your diet changes and exercise. Please continue with the Saxenda.  If you develop any GI upset please let us know. Please go to get the x-ray of your left wrist when you are able to.  We will contact you with the results.

## 2019-06-05 NOTE — Progress Notes (Signed)
[name], MD Phone: [phone]  Margaret Stark is a 63 y.o. female who presents today for follow-up.  Obesity: Patient notes she has cut back on soda 1 to 2/week.  She is decreasing her sweet and junk intake as well.  She is cutting down on juice intake.  She has been exercising by walking 1.5 miles 3 times weekly.  She has been taking the Saxenda.  She has had no nausea or GI upset.  She typically does not eat anything for breakfast.  Yesterday she had 6 ounces of chicken for lunch with nothing else.  At dinner she had ribs with squashing and collards.  She had water to drink.  She will typically have a lean cuisine for lunch during the week though she is working on moving that toward salads with protein.  Not many snacks though she has been eating some carrots with ranch dressing recently.  Left wrist cyst: Patient notes over the last 4 weeks she has had a prominence over the radial aspect of her left wrist.  She notes the nurse at her work mentioned that it could be a ganglion cyst.  There is no pain.  There is no injury.  Social History   Tobacco Use  Smoking Status Never Smoker  Smokeless Tobacco Never Used     ROS see history of present illness  Objective  Physical Exam Vitals:   06/05/19 1126  BP: 120/80  Pulse: 87  Temp: (!) 96.5 F (35.8 C)  SpO2: 97%    BP Readings from Last 3 Encounters:  06/05/19 120/80  05/10/19 120/80  11/24/18 121/84   Wt Readings from Last 3 Encounters:  06/05/19 247 lb 9.6 oz (112.3 kg)  05/10/19 253 lb 6.4 oz (114.9 kg)  03/15/19 250 lb (113.4 kg)    Physical Exam Constitutional:      General: She is not in acute distress.    Appearance: She is not diaphoretic.  Cardiovascular:     Rate and Rhythm: Normal rate and regular rhythm.     Heart sounds: Normal heart sounds.  Pulmonary:     Effort: Pulmonary effort is normal.     Breath sounds: Normal breath sounds.  Musculoskeletal:     Comments: Left wrist radial aspect  with slight prominence over the radial styloid process, it is difficult to tell if this is a bony prominence or a soft tissue prominence with a firm ganglion cyst being a possibility, there is no tenderness, no warmth, no erythema  Skin:    General: Skin is warm and dry.  Neurological:     Mental Status: She is alert.      Assessment/Plan: Please see individual problem list.  Morbid obesity ([place]) Weight has trended down. Discussed continuing diet and exercise. Advised on adding breakfast daily. She will continue saxenda and continue to taper up on this. She will contact us if she develops GI issues with the saxenda. She denies personal and family history of thyroid, parathyroid, and adrenal gland cancer. Advised of thyroid cancer issues seen in rat studies, though not in human studies. Discussed small risk of thyroid issues with this medication and if she develops any issues with thyroid changes she will let us know right away.   Ganglion cyst of wrist, left Likely ganglion cyst though given that it is difficult to tell if it is a bony process or not we will obtain an XRay. She will otherwise monitor for changes.    Health Maintenance: TD given  Orders  Placed This Encounter  Procedures  . DG Wrist Complete Left    Standing Status:   Future    Standing Expiration Date:   08/04/2020    Order Specific Question:   Reason for Exam (SYMPTOM  OR DIAGNOSIS REQUIRED)    Answer:   radial aspect bony prominence vs ganglion cyst    Order Specific Question:   Preferred imaging location?    Answer:   [facility]    Order Specific Question:   Radiology Contrast Protocol - do NOT remove file path    Answer:   [email or web address]  . Td : Tetanus/diphtheria >7yo Preservative  free    No orders of the defined types were placed in this encounter.    [name], MD [place]

## 2019-06-06 ENCOUNTER — Ambulatory Visit (INDEPENDENT_AMBULATORY_CARE_PROVIDER_SITE_OTHER)
Admission: RE | Admit: 2019-06-06 | Discharge: 2019-06-06 | Disposition: A | Discharge status: Home / Self Care | Payer: BC Managed Care – PPO | Source: Ambulatory Visit | Attending: Family Medicine | Admitting: Family Medicine

## 2019-06-06 DIAGNOSIS — M67432 Ganglion, left wrist: Secondary | ICD-10-CM

## 2019-06-14 ENCOUNTER — Other Ambulatory Visit: Payer: Self-pay | Admitting: Family Medicine

## 2019-07-10 ENCOUNTER — Other Ambulatory Visit: Payer: Self-pay | Admitting: Family Medicine

## 2019-07-11 ENCOUNTER — Other Ambulatory Visit: Payer: Self-pay | Admitting: Family Medicine

## 2019-08-02 ENCOUNTER — Encounter: Payer: Self-pay | Admitting: Family Medicine

## 2019-08-02 ENCOUNTER — Telehealth: Payer: Self-pay | Admitting: Family Medicine

## 2019-08-02 ENCOUNTER — Ambulatory Visit (INDEPENDENT_AMBULATORY_CARE_PROVIDER_SITE_OTHER): Payer: BC Managed Care – PPO | Admitting: Family Medicine

## 2019-08-02 ENCOUNTER — Other Ambulatory Visit: Payer: Self-pay

## 2019-08-02 DIAGNOSIS — R7303 Prediabetes: Secondary | ICD-10-CM | POA: Diagnosis not present

## 2019-08-02 DIAGNOSIS — K5900 Constipation, unspecified: Secondary | ICD-10-CM | POA: Insufficient documentation

## 2019-08-02 DIAGNOSIS — K5903 Drug induced constipation: Secondary | ICD-10-CM

## 2019-08-02 MED ORDER — SAXENDA 18 MG/3ML ~~LOC~~ SOPN: 3.0000 mg | PEN_INJECTOR | Freq: Every day | SUBCUTANEOUS | 1 refills | Status: DC

## 2019-08-02 MED ORDER — INSULIN PEN NEEDLE 32G X 6 MM MISC: 1 refills | Status: DC

## 2019-08-02 NOTE — Telephone Encounter (Signed)
The patient has labs ordered from a prior visit.  These do need to be completed to follow-up on her cholesterol and A1c.  Please call her and get this scheduled.  Thanks.

## 2019-08-02 NOTE — Assessment & Plan Note (Signed)
New onset.  Likely related to Saxenda.  Discussed taking MiraLAX once daily and seeing if that is beneficial.  If not beneficial she could go to taking it twice daily.  If that is not helpful she will let us know.

## 2019-08-02 NOTE — Progress Notes (Signed)
Virtual Visit via video Note  This visit type was conducted due to national recommendations for restrictions regarding the COVID-19 pandemic (e.g. social distancing).  This format is felt to be most appropriate for this patient at this time.  All issues noted in this document were discussed and addressed.  No physical exam was performed (except for noted visual exam findings with Video Visits).   I connected with Margaret Stark today at  2:45 PM EST by a video enabled telemedicine application and verified that I am speaking with the correct person using two identifiers. Location patient: work Location provider: work Persons participating in the virtual visit: patient, provider  I discussed the limitations, risks, security and privacy concerns of performing an evaluation and management service by telephone and the availability of in person appointments. I also discussed with the patient that there may be a patient responsible charge related to this service. The patient expressed understanding and agreed to proceed.  Reason for visit: Follow-up.  HPI: Obesity: Patient continues on Saxenda.  Notes it does curb her appetite.  No nausea.  She continues with exercise with walking, riding a stationary bike, and using weights.  She has cut sodas and sweets out for the most part.  Lots of vegetables and meats.  Weight has trended down about 13 pounds since the middle of October.  Constipation: Patient notes the Kirke Shaggy has been leading to some constipation.  She will go about 5 days between bowel movements and to use MiraLAX as needed or an enema to help with this.  She does note some bloating.  Some passing gas.  No abdominal discomfort.  No blood in her stool.  Prior to [name] she was having bowel movements once daily.  Prediabetes: Prior A1c was elevated.  She was work on diet and exercise which she has done as above.   ROS: See pertinent positives and negatives per HPI.  Past Medical History:   Diagnosis Date  . Arthritis   . CAD (coronary artery disease)    a. LHC 11/24/2018: mild non-obstructive CAD with 30% stenosis of proximal LAD and 10% stenosis of  proximal to mid RCA    Past Surgical History:  Procedure Laterality Date  . COLONOSCOPY WITH PROPOFOL N/A 07/15/2015   Procedure: COLONOSCOPY WITH PROPOFOL;  Surgeon: Hulen Luster, MD;  Location: [facility] ENDOSCOPY;  Service: Gastroenterology;  Laterality: N/A;  . LEFT HEART CATH AND CORONARY ANGIOGRAPHY N/A 11/24/2018   Procedure: LEFT HEART CATH AND CORONARY ANGIOGRAPHY;  Surgeon: Burnell Blanks, MD;  Location: [place] [facility];  Service: Cardiovascular;  Laterality: N/A;    Family History  Problem Relation Age of Onset  . Hypertension Mother   . Hypertension Father   . Cancer Sister        colon - age 67  . Heart disease Neg Hx     SOCIAL HX: Non-smoker   Current Outpatient Medications:  .  aspirin EC 81 MG tablet, Take 1 tablet (81 mg total) by mouth daily., Disp: 30 tablet, Rfl: 11 .  atorvastatin (LIPITOR) 40 MG tablet, TAKE 1 TABLET BY MOUTH EVERY DAY, Disp: 30 tablet, Rfl: 0 .  influenza vaccine (FLUCELVAX QUADRIVALENT) 0.5 ML injection, Flucelvax Quad 2019-2020 (PF) 60 mcg (15 mcg x 4)/0.5 mL IM syringe  TO BE ADMINISTERED BY PHARMACIST FOR IMMUNIZATION, Disp: , Rfl:  .  Insulin Pen Needle 32G X 6 MM MISC, Use once daily with saxenda, Disp: 90 each, Rfl: 1 .  Latanoprost 0.005 % EMUL,  latanoprost 0.005 % eye drops, Disp: , Rfl:  .  Liraglutide -Weight Management (SAXENDA) 18 MG/3ML SOPN, Inject 3 mg into the skin daily., Disp: 15 pen, Rfl: 1 .  nitroGLYCERIN (NITROSTAT) 0.4 MG SL tablet, Place 1 tablet (0.4 mg total) under the tongue every 5 (five) minutes as needed for chest pain., Disp: 100 tablet, Rfl: 3 .  Vitamin D, Ergocalciferol, (DRISDOL) 1.25 MG (50000 UT) CAPS capsule, ergocalciferol (vitamin D2) 1,250 mcg (50,000 unit) capsule, Disp: , Rfl:   EXAM:  VITALS per patient if applicable:  GENERAL:  alert, oriented, appears well and in no acute distress  HEENT: atraumatic, conjunttiva clear, no obvious abnormalities on inspection of external nose and ears  NECK: normal movements of the head and neck  LUNGS: on inspection no signs of respiratory distress, breathing rate appears normal, no obvious gross SOB, gasping or wheezing  CV: no obvious cyanosis  MS: moves all visible extremities without noticeable abnormality  PSYCH/NEURO: pleasant and cooperative, no obvious depression or anxiety, speech and thought processing grossly intact  ASSESSMENT AND PLAN:  Discussed the following assessment and plan:  Morbid obesity ([place]) Trending in the right direction.  She will continue diet and exercise.  I have refilled her Saxenda.  Constipation New onset.  Likely related to Saxenda.  Discussed taking MiraLAX once daily and seeing if that is beneficial.  If not beneficial she could go to taking it twice daily.  If that is not helpful she will let us know.  Prediabetes Encouraged continued diet and exercise.  The patient does need lab work completed.  I will have somebody contact her to get this scheduled.   No orders of the defined types were placed in this encounter.   Meds ordered this encounter  Medications  . Liraglutide -Weight Management (SAXENDA) 18 MG/3ML SOPN    Sig: Inject 3 mg into the skin daily.    Dispense:  15 pen    Refill:  1  . Insulin Pen Needle 32G X 6 MM MISC    Sig: Use once daily with saxenda    Dispense:  90 each    Refill:  1     I discussed the assessment and treatment plan with the patient. The patient was provided an opportunity to ask questions and all were answered. The patient agreed with the plan and demonstrated an understanding of the instructions.   The patient was advised to call back or seek an in-person evaluation if the symptoms worsen or if the condition fails to improve as anticipated.  [name], MD

## 2019-08-02 NOTE — Assessment & Plan Note (Signed)
Trending in the right direction.  She will continue diet and exercise.  I have refilled her Saxenda.

## 2019-08-02 NOTE — Assessment & Plan Note (Signed)
Encouraged continued diet and exercise.  The patient does need lab work completed.  I will have somebody contact her to get this scheduled.

## 2019-08-03 ENCOUNTER — Other Ambulatory Visit: Payer: Self-pay | Admitting: Family Medicine

## 2019-08-09 NOTE — Telephone Encounter (Signed)
Called and left a voicemail for the patient to cal and schedule a lab appointment. Miriah Maruyama,cma

## 2019-08-16 ENCOUNTER — Other Ambulatory Visit: Payer: Self-pay | Admitting: Family Medicine

## 2019-08-17 NOTE — Telephone Encounter (Signed)
Made 2- 3 attempts to reach patient by phone, a message was sent through mychart for the patient to call the office to schedule a lab appt.  Margaret Stark,cma

## 2019-09-01 ENCOUNTER — Telehealth: Payer: Self-pay | Admitting: Family Medicine

## 2019-09-01 ENCOUNTER — Other Ambulatory Visit: Payer: Self-pay

## 2019-09-01 MED ORDER — SAXENDA 18 MG/3ML ~~LOC~~ SOPN: 3.0000 mg | PEN_INJECTOR | Freq: Every day | SUBCUTANEOUS | 1 refills | Status: DC

## 2019-09-01 NOTE — Telephone Encounter (Signed)
Pt needs refill on Liraglutide -Weight Management (SAXENDA) 18 MG/3ML SOPN

## 2019-09-01 NOTE — Telephone Encounter (Signed)
Medication sent to pharamcy per patient request.  Sharvi Mooneyhan,cma

## 2019-09-08 ENCOUNTER — Other Ambulatory Visit (INDEPENDENT_AMBULATORY_CARE_PROVIDER_SITE_OTHER): Payer: BC Managed Care – PPO

## 2019-09-08 ENCOUNTER — Other Ambulatory Visit: Payer: Self-pay

## 2019-09-08 DIAGNOSIS — R7303 Prediabetes: Secondary | ICD-10-CM

## 2019-09-08 DIAGNOSIS — E785 Hyperlipidemia, unspecified: Secondary | ICD-10-CM | POA: Diagnosis not present

## 2019-09-08 LAB — HEMOGLOBIN A1C: Hgb A1c MFr Bld: 6.2 % (ref 4.6–6.5)

## 2019-09-08 LAB — COMPREHENSIVE METABOLIC PANEL
ALT: 14 U/L (ref 0–35)
AST: 15 U/L (ref 0–37)
Albumin: 4.1 g/dL (ref 3.5–5.2)
Alkaline Phosphatase: 81 U/L (ref 39–117)
BUN: 11 mg/dL (ref 6–23)
CO2: 29 mEq/L (ref 19–32)
Calcium: 9.6 mg/dL (ref 8.4–10.5)
Chloride: 105 mEq/L (ref 96–112)
Creatinine, Ser: 0.89 mg/dL (ref 0.40–1.20)
GFR: 77.47 mL/min (ref >60.00)
Glucose, Bld: 95 mg/dL (ref 70–99)
Potassium: 3.9 mEq/L (ref 3.5–5.1)
Sodium: 139 mEq/L (ref 135–145)
Total Bilirubin: 0.5 mg/dL (ref 0.2–1.2)
Total Protein: 7 g/dL (ref 6.0–8.3)

## 2019-09-08 LAB — LDL CHOLESTEROL, DIRECT: Direct LDL: 73 mg/dL

## 2019-09-27 ENCOUNTER — Telehealth: Payer: Self-pay

## 2019-09-27 NOTE — Telephone Encounter (Signed)
I called the patient and the patient's pharmacy [facility] and informed them both  that the medication Saxenda was approved from 09/25/2019=09/24/2020.  Margaret Stark,cma

## 2019-11-23 ENCOUNTER — Telehealth: Payer: Self-pay | Admitting: Family Medicine

## 2019-11-23 NOTE — Telephone Encounter (Signed)
Please contact the patient. She needs follow-up for her weight and the saxenda. Thanks.

## 2019-11-23 NOTE — Telephone Encounter (Signed)
I called and scheduled the patient for a visit to check on weight loss and medicaitn.  Margaret Stark,cma

## 2019-12-04 ENCOUNTER — Ambulatory Visit (INDEPENDENT_AMBULATORY_CARE_PROVIDER_SITE_OTHER): Payer: BC Managed Care – PPO | Admitting: Family Medicine

## 2019-12-04 ENCOUNTER — Other Ambulatory Visit: Payer: Self-pay

## 2019-12-04 ENCOUNTER — Encounter: Payer: Self-pay | Admitting: Family Medicine

## 2019-12-04 DIAGNOSIS — T148XXA Other injury of unspecified body region, initial encounter: Secondary | ICD-10-CM

## 2019-12-04 DIAGNOSIS — E669 Obesity, unspecified: Secondary | ICD-10-CM | POA: Diagnosis not present

## 2019-12-04 DIAGNOSIS — R7303 Prediabetes: Secondary | ICD-10-CM | POA: Diagnosis not present

## 2019-12-04 DIAGNOSIS — R109 Unspecified abdominal pain: Secondary | ICD-10-CM | POA: Insufficient documentation

## 2019-12-04 NOTE — Assessment & Plan Note (Signed)
Suspect muscle strain on her right side.  She will trial ibuprofen scheduled for the next 2 days and then as needed.  If not improving over the next week she will let us know.

## 2019-12-04 NOTE — Patient Instructions (Signed)
Nice to see you. I suspect you have strained a muscle. You can try 600 mg of ibuprofen every 8 hours for the next 2 days and then every 8 hours as needed.  If you need it much beyond 4 to 5 days please let us know.  If your symptoms do not improve over the next week please let me know. Please continue diet and exercise and the Saxenda.

## 2019-12-04 NOTE — Assessment & Plan Note (Signed)
Weight has trended down.  Encouraged continued diet and exercise.  She will continue Saxenda.  Follow-up in 3 months.

## 2019-12-04 NOTE — Progress Notes (Signed)
  [name], MD Phone: [phone]  Margaret Stark is a 64 y.o. female who presents today for follow-up.  Obesity: Patient notes she has made dietary changes.  She is cut down on starches, soda, and juice.  Eating more protein and vegetables.  She is walking about a mile a day.  The Kirke Shaggy has been helpful.  She is down about 23 pounds since October of last year.  Prediabetes: A1c improved to 6.2.  No polyuria or polydipsia.  Right side pain: Notes this has been going on for about 2 weeks.  Typically only bothers her if she turns wrong.  Its a sharp discomfort.  No injury.  No numbness, weakness, incontinence, or blood in her urine.  Social History   Tobacco Use  Smoking Status Never Smoker  Smokeless Tobacco Never Used     ROS see history of present illness  Objective  Physical Exam Vitals:   12/04/19 1558  BP: 125/80  Pulse: 75  Temp: 97.6 F (36.4 C)  SpO2: 99%    BP Readings from Last 3 Encounters:  12/04/19 125/80  06/05/19 120/80  05/10/19 120/80   Wt Readings from Last 3 Encounters:  12/04/19 230 lb 9.6 oz (104.6 kg)  08/02/19 240 lb (108.9 kg)  06/05/19 247 lb 9.6 oz (112.3 kg)    Physical Exam Constitutional:      General: She is not in acute distress.    Appearance: She is not diaphoretic.  Cardiovascular:     Rate and Rhythm: Normal rate and regular rhythm.     Heart sounds: Normal heart sounds.  Pulmonary:     Effort: Pulmonary effort is normal.     Breath sounds: Normal breath sounds.  Musculoskeletal:       Arms:  Skin:    General: Skin is warm and dry.  Neurological:     Mental Status: She is alert.      Assessment/Plan: Please see individual problem list.  Prediabetes Continue diet and exercise.  Check A1c at next visit.  Obesity (BMI 35.0-39.9 without comorbidity) Weight has trended down.  Encouraged continued diet and exercise.  She will continue Saxenda.  Follow-up in 3 months.  Muscle strain Suspect muscle strain  on her right side.  She will trial ibuprofen scheduled for the next 2 days and then as needed.  If not improving over the next week she will let us know. She will take the ibuprofen with food.   No orders of the defined types were placed in this encounter.   No orders of the defined types were placed in this encounter.   This visit occurred during the SARS-CoV-2 public health emergency.  Safety protocols were in place, including screening questions prior to the visit, additional usage of staff PPE, and extensive cleaning of exam room while observing appropriate contact time as indicated for disinfecting solutions.    [name], MD [place]

## 2019-12-04 NOTE — Assessment & Plan Note (Signed)
Continue diet and exercise.  Check A1c at next visit.

## 2020-01-31 ENCOUNTER — Other Ambulatory Visit: Payer: Self-pay | Admitting: Family Medicine

## 2020-03-05 ENCOUNTER — Ambulatory Visit (INDEPENDENT_AMBULATORY_CARE_PROVIDER_SITE_OTHER): Payer: BC Managed Care – PPO | Admitting: Family Medicine

## 2020-03-05 ENCOUNTER — Encounter: Payer: Self-pay | Admitting: Family Medicine

## 2020-03-05 ENCOUNTER — Other Ambulatory Visit: Payer: Self-pay

## 2020-03-05 DIAGNOSIS — M5380 Other specified dorsopathies, site unspecified: Secondary | ICD-10-CM | POA: Diagnosis not present

## 2020-03-05 DIAGNOSIS — E669 Obesity, unspecified: Secondary | ICD-10-CM | POA: Diagnosis not present

## 2020-03-05 DIAGNOSIS — E785 Hyperlipidemia, unspecified: Secondary | ICD-10-CM

## 2020-03-05 MED ORDER — SAXENDA 18 MG/3ML ~~LOC~~ SOPN: PEN_INJECTOR | SUBCUTANEOUS | 1 refills | Status: DC

## 2020-03-05 NOTE — Patient Instructions (Signed)

## 2020-03-05 NOTE — Assessment & Plan Note (Signed)
Benign exam.  Given exercises to complete.

## 2020-03-05 NOTE — Progress Notes (Signed)
  [name], MD Phone: [phone]  Margaret Stark is a 64 y.o. female who presents today for f/u.  Obesity: Patient continues on Saxenda.  Weight has been trending down.  She has decreased her carbohydrate intake and soda intake.  She is walking 4 times a week 3 to 4 miles at a time for exercise.  Notes her weight has seemed to plateau recently.  Hyperlipidemia: Taking Lipitor.  No chest pain.  No right upper quadrant pain.  No myalgias.  Low back tightness: Patient notes this has been going on recently.  Her muscles just feel tight.  No significant pain.  No radiation.  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  She has had no progression of that.  No weakness.  No incontinence.  Social History   Tobacco Use  Smoking Status Never Smoker  Smokeless Tobacco Never Used     ROS see history of present illness  Objective  Physical Exam Vitals:   03/05/20 1105  BP: 115/70  Pulse: 78  Temp: 98.6 F (37 C)  SpO2: 98%    BP Readings from Last 3 Encounters:  03/05/20 115/70  12/04/19 125/80  06/05/19 120/80   Wt Readings from Last 3 Encounters:  03/05/20 221 lb 3.2 oz (100.3 kg)  12/04/19 230 lb 9.6 oz (104.6 kg)  08/02/19 240 lb (108.9 kg)    Physical Exam Constitutional:      General: She is not in acute distress.    Appearance: She is not diaphoretic.  Cardiovascular:     Rate and Rhythm: Normal rate and regular rhythm.     Heart sounds: Normal heart sounds.  Pulmonary:     Effort: Pulmonary effort is normal.     Breath sounds: Normal breath sounds.  Musculoskeletal:     Comments: No midline spine tenderness, no midline spine step-off, no muscular back tenderness  Skin:    General: Skin is warm and dry.  Neurological:     Mental Status: She is alert.     Comments: 5/5 strength bilateral quads, hamstrings, plantar flexion,  and dorsiflexion, sensation light touch intact bilateral lower extremities      Assessment/Plan: Please see individual problem list.  Hyperlipidemia Continue Lipitor.  Obesity (BMI 35.0-39.9 without comorbidity) Okay to continue Saxenda.  Encouraged continued dietary changes.  Discussed higher intensity exercise given that her weight has seemed to plateau.  Back tightness Benign exam.  Given exercises to complete.   No orders of the defined types were placed in this encounter.   Meds ordered this encounter  Medications  . Liraglutide -Weight Management (SAXENDA) 18 MG/3ML SOPN    Sig: INJECT 3MG  INTO THE SKIN DAILY    Dispense:  15 mL    Refill:  1    This visit occurred during the SARS-CoV-2 public health emergency.  Safety protocols were in place, including screening questions prior to the visit, additional usage of staff PPE, and extensive cleaning of exam room while observing appropriate contact time as indicated for disinfecting solutions.    [name], MD [place]

## 2020-03-05 NOTE — Assessment & Plan Note (Signed)
Okay to continue Saxenda.  Encouraged continued dietary changes.  Discussed higher intensity exercise given that her weight has seemed to plateau.

## 2020-03-05 NOTE — Assessment & Plan Note (Signed)
-  Continue Lipitor °

## 2020-03-14 ENCOUNTER — Telehealth: Payer: Self-pay | Admitting: Family Medicine

## 2020-03-14 NOTE — Telephone Encounter (Signed)
Pt called to see if she can get another note for her to be able to use the elevator at work. Please advise

## 2020-03-19 NOTE — Telephone Encounter (Signed)
Is this because of her knees? If so, I can write the letter.

## 2020-03-20 NOTE — Telephone Encounter (Signed)
Letter completed.  Please make available for pickup.

## 2020-03-20 NOTE — Telephone Encounter (Signed)
Patient states this is for her knees.

## 2020-03-20 NOTE — Telephone Encounter (Signed)
Patient returned office phone call. Patient will pick letter up tomorrow.

## 2020-03-20 NOTE — Telephone Encounter (Signed)
Left message to return call 

## 2020-04-20 ENCOUNTER — Other Ambulatory Visit: Payer: Self-pay | Admitting: Family Medicine

## 2020-04-26 IMAGING — DX CHEST - 2 VIEW
2 series · 2 of 2 positions shown · non-contrast
Comparison: None.

CLINICAL DATA: Chest pain and dizziness

EXAM:
CHEST - 2 VIEW

[x chest ap]
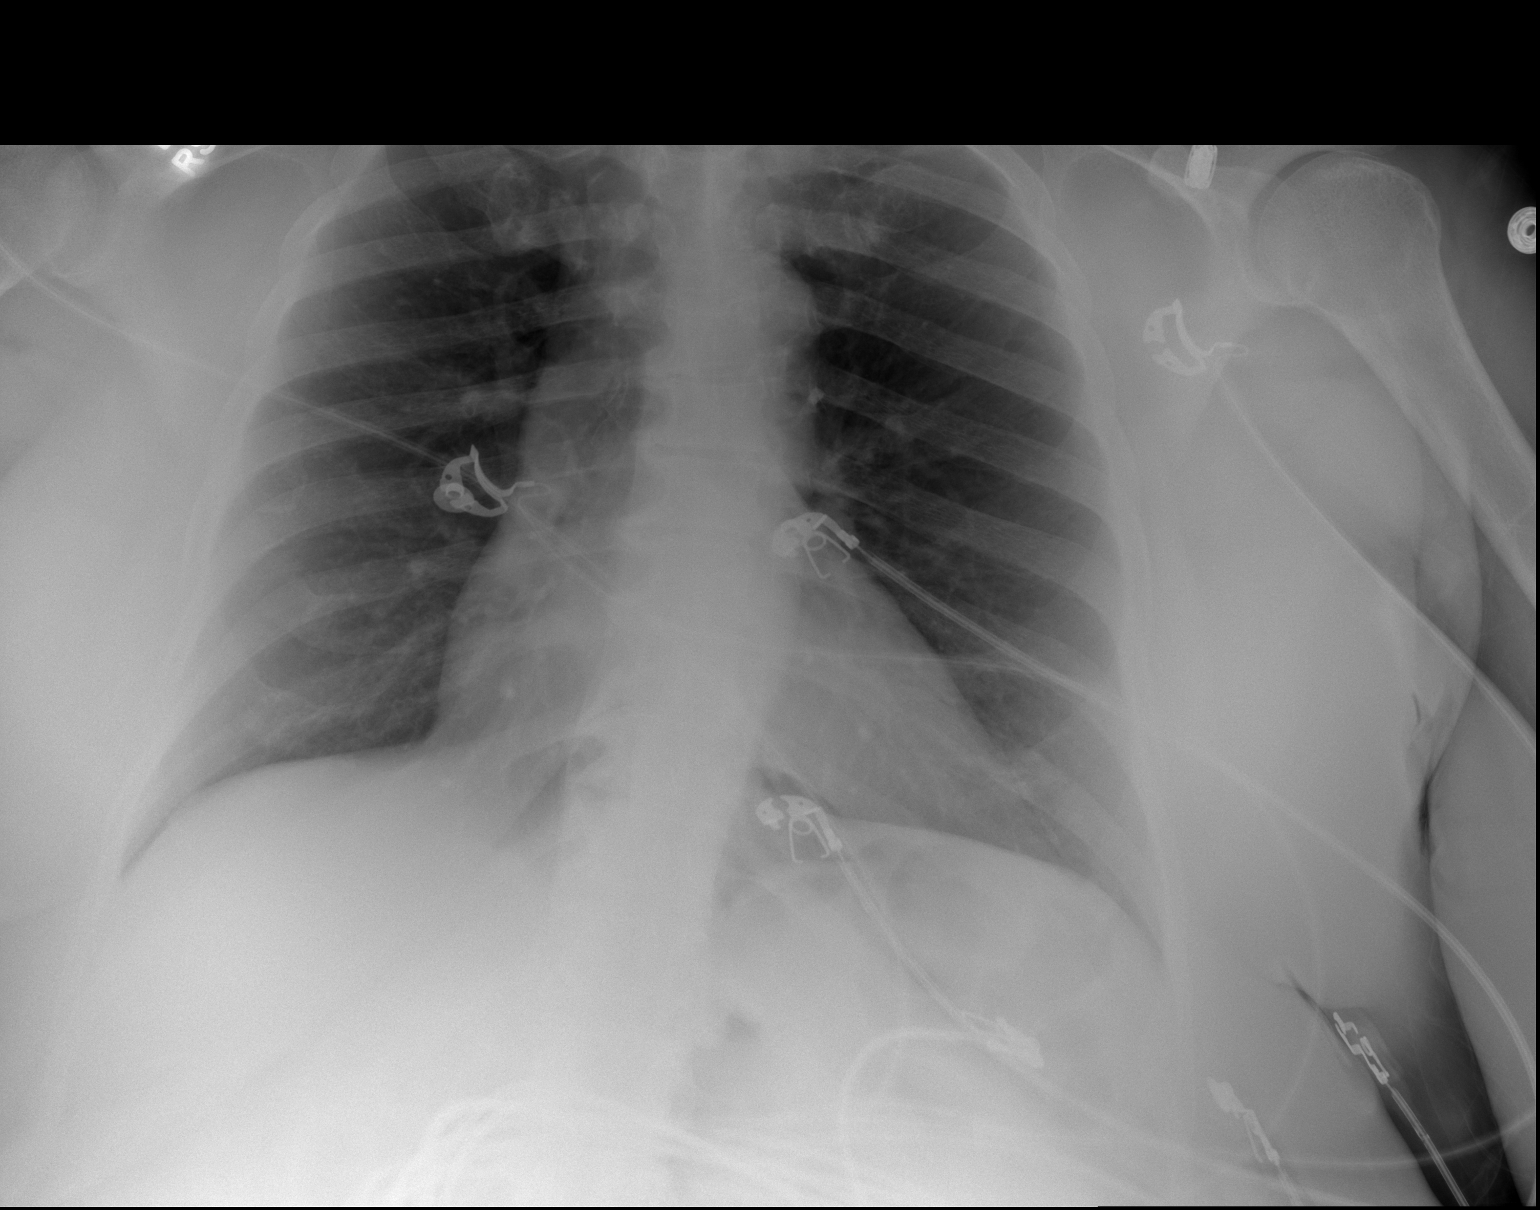

[w chest lat]
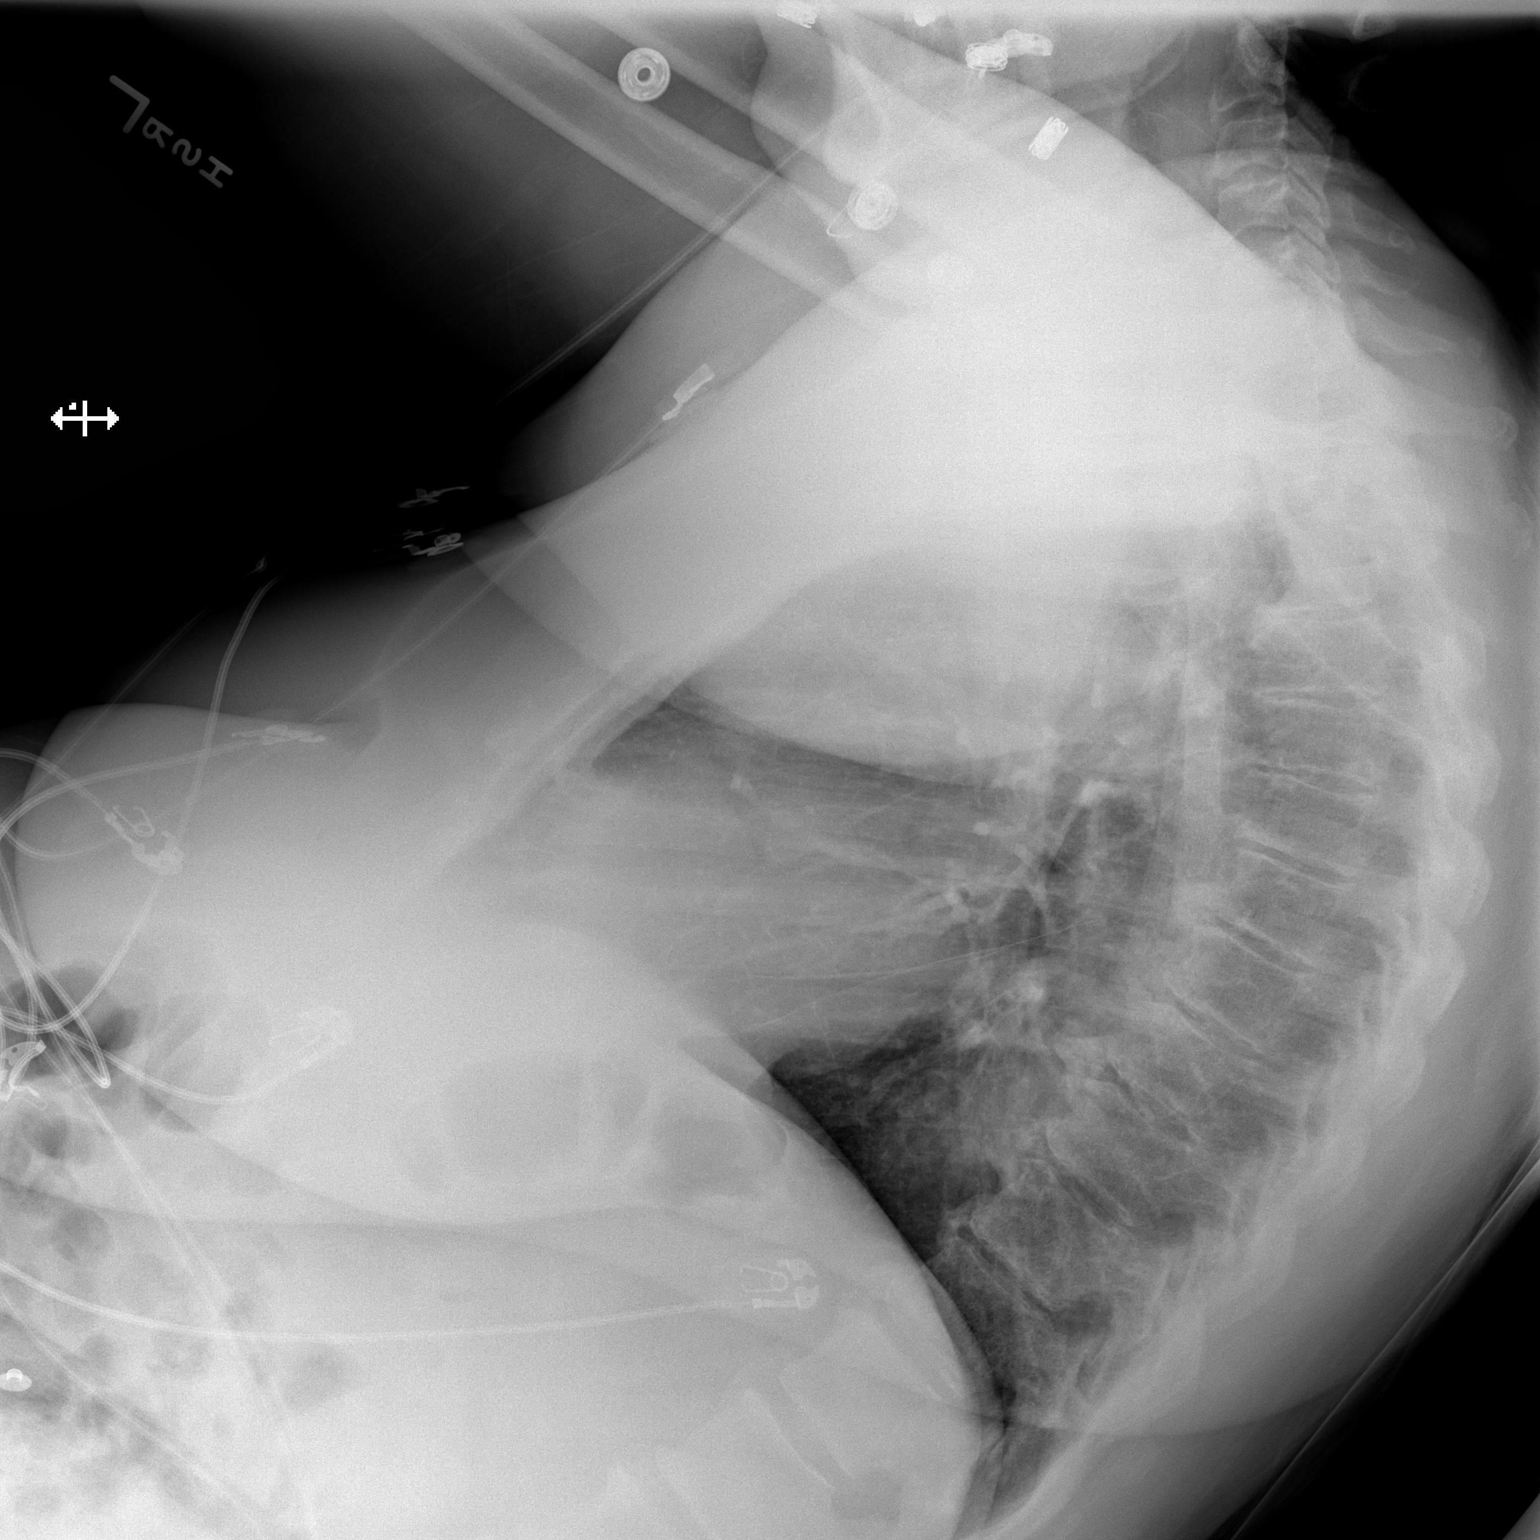

[2 of 2 positions shown; findings below may reference images not displayed]

FINDINGS: Lungs are clear. Heart size and pulmonary vascularity are normal. No
adenopathy. There is degenerative change in the thoracic spine.
IMPRESSION: No edema or consolidation.

## 2020-06-05 ENCOUNTER — Encounter: Payer: Self-pay | Admitting: Family Medicine

## 2020-06-05 ENCOUNTER — Other Ambulatory Visit: Payer: Self-pay

## 2020-06-05 ENCOUNTER — Ambulatory Visit (INDEPENDENT_AMBULATORY_CARE_PROVIDER_SITE_OTHER): Payer: BC Managed Care – PPO | Admitting: Family Medicine

## 2020-06-05 VITALS — BP 118/80 | HR 76 | Temp 97.7°F | Ht 64.0 in

## 2020-06-05 DIAGNOSIS — E669 Obesity, unspecified: Secondary | ICD-10-CM | POA: Diagnosis not present

## 2020-06-05 DIAGNOSIS — R109 Unspecified abdominal pain: Secondary | ICD-10-CM

## 2020-06-05 DIAGNOSIS — R7303 Prediabetes: Secondary | ICD-10-CM

## 2020-06-05 DIAGNOSIS — Z23 Encounter for immunization: Secondary | ICD-10-CM | POA: Diagnosis not present

## 2020-06-05 DIAGNOSIS — G479 Sleep disorder, unspecified: Secondary | ICD-10-CM

## 2020-06-05 NOTE — Progress Notes (Signed)
[name], MD Phone: [phone]  Margaret Stark is a 64 y.o. female who presents today for f/u.  Obesity: Patient refused weight today.  She has gotten off track with her diet and exercise.  She is been stressed from work and note she has not been able to exercise as much.  She has been doing some exercise.  She has been emotionally eating as well.  Sleeping difficulty: Patient notes that she will go to bed around 6 PM some days when she is particularly drained from work.  She will wake up around midnight and be awake until about 3 and then go back to sleep.  She denies depression.  She notes frustration with her job.  Right flank pain: This continues to be an issue.  Only bothers her when she moves a certain way or when she rolls over in bed.  Describes it as a sharp pain.  There is no radiation.  No abdominal pain.  No blood in her stool or urine.  No vaginal bleeding.  No nausea or vomiting.  Social History   Tobacco Use  Smoking Status Never Smoker  Smokeless Tobacco Never Used     ROS see history of present illness  Objective  Physical Exam Vitals:   06/05/20 1601  BP: 118/80  Pulse: 76  Temp: 97.7 F (36.5 C)  SpO2: 97%    BP Readings from Last 3 Encounters:  06/05/20 118/80  03/05/20 115/70  12/04/19 125/80   Wt Readings from Last 3 Encounters:  03/05/20 221 lb 3.2 oz (100.3 kg)  12/04/19 230 lb 9.6 oz (104.6 kg)  08/02/19 240 lb (108.9 kg)    Physical Exam Constitutional:      General: She is not in acute distress.    Appearance: She is not diaphoretic.  Cardiovascular:     Rate and Rhythm: Normal rate and regular rhythm.     Heart sounds: Normal heart sounds.  Pulmonary:     Effort: Pulmonary effort is normal.     Breath sounds: Normal breath sounds.  Abdominal:     General: Bowel sounds are normal. There is no distension.     Palpations: Abdomen is soft.     Tenderness: There is no abdominal tenderness. There is no guarding or rebound.       Comments: No tenderness over her right flank  Skin:    General: Skin is warm and dry.  Neurological:     Mental Status: She is alert.      Assessment/Plan: Please see individual problem list.  Problem List Items Addressed This Visit    Obesity (BMI 35.0-39.9 without comorbidity)    Patient declined weight today.  We discussed that she needs to get back on track with diet and exercise.  She can continue on the Saxenda 3 mg daily at this time.  Follow-up in 3 months.      Relevant Orders   HgB A1c   Prediabetes    Check A1c.      Right flank pain - Primary    I suspect this is a muscle strain though given persistence and benign exam will obtain imaging to rule out an underlying cause.      Relevant Orders   CT Abdomen Pelvis W Contrast   Basic Metabolic Panel (BMET)   Sleeping difficulty    A lot of this is related to stress and frustration at work.  Discussed trying to go to bed a little later to be able to sleep through  the night.       Other Visit Diagnoses    Need for immunization against influenza       Relevant Orders   Flu Vaccine QUAD 36+ mos IM (Completed)      This visit occurred during the SARS-CoV-2 public health emergency.  Safety protocols were in place, including screening questions prior to the visit, additional usage of staff PPE, and extensive cleaning of exam room while observing appropriate contact time as indicated for disinfecting solutions.    [name], MD [place]

## 2020-06-05 NOTE — Assessment & Plan Note (Signed)
Check A1c. 

## 2020-06-05 NOTE — Assessment & Plan Note (Signed)
A lot of this is related to stress and frustration at work.  Discussed trying to go to bed a little later to be able to sleep through the night.

## 2020-06-05 NOTE — Patient Instructions (Signed)
Nice to see you. We will get a CT scan ordered. Please try to work on your diet and exercise as we discussed. Please try to go to bed at a consistent time so that she do not wake up in the middle the night.

## 2020-06-05 NOTE — Assessment & Plan Note (Addendum)
Patient declined weight today.  We discussed that she needs to get back on track with diet and exercise.  She can continue on the Saxenda 3 mg daily at this time.  Follow-up in 3 months.

## 2020-06-05 NOTE — Assessment & Plan Note (Signed)
I suspect this is a muscle strain though given persistence and benign exam will obtain imaging to rule out an underlying cause.

## 2020-06-06 LAB — BASIC METABOLIC PANEL
BUN: 10 mg/dL (ref 6–23)
CO2: 29 mEq/L (ref 19–32)
Calcium: 9.4 mg/dL (ref 8.4–10.5)
Chloride: 103 mEq/L (ref 96–112)
Creatinine, Ser: 0.93 mg/dL (ref 0.40–1.20)
GFR: 65.22 mL/min (ref >60.00)
Glucose, Bld: 85 mg/dL (ref 70–99)
Potassium: 3.8 mEq/L (ref 3.5–5.1)
Sodium: 139 mEq/L (ref 135–145)

## 2020-06-06 LAB — HEMOGLOBIN A1C: Hgb A1c MFr Bld: 6.3 % (ref 4.6–6.5)

## 2020-06-17 ENCOUNTER — Telehealth: Payer: Self-pay | Admitting: Family Medicine

## 2020-06-17 NOTE — Telephone Encounter (Signed)
Per pt insurance the CT was denied due to clinical indication submitted still does not support the requested service. Based on the information we have, this test is not medically necessary. This test should be used for a certain condition when the criteria are met.please advise and Thank you!Marland Kitchen

## 2020-06-18 NOTE — Telephone Encounter (Signed)
I called and LVM for the patient to call for an update on CT scan.  Wyett Narine,cma

## 2020-06-18 NOTE — Telephone Encounter (Signed)
Please let the patient know that her insurance did not approve the CT scan.  They felt that the clinical indication did not support the need for a CT scan.  Please see if she continues to have pain in her side.  Thanks.

## 2020-06-24 ENCOUNTER — Telehealth: Payer: Self-pay

## 2020-06-24 NOTE — Telephone Encounter (Signed)
LVM for the patient to call back for update on CT scan.  Margaret Stark,cma

## 2020-06-26 ENCOUNTER — Telehealth: Payer: Self-pay | Admitting: Family Medicine

## 2020-06-26 NOTE — Telephone Encounter (Signed)
Patient was returning call from [facility]

## 2020-07-04 NOTE — Telephone Encounter (Signed)
LVM for the patient to call back to discuss denial of CT.   Artemisa Sladek,cma

## 2020-07-15 ENCOUNTER — Other Ambulatory Visit: Payer: Self-pay | Admitting: Family Medicine

## 2020-08-07 NOTE — Telephone Encounter (Signed)
Message sent to patient via mychart becaseu patient could not be reached by phone.  Margaret Stark,cma

## 2020-09-09 ENCOUNTER — Ambulatory Visit: Payer: BC Managed Care – PPO | Admitting: Family Medicine

## 2020-10-17 ENCOUNTER — Other Ambulatory Visit: Payer: Self-pay | Admitting: Family Medicine

## 2020-10-17 ENCOUNTER — Other Ambulatory Visit: Payer: Self-pay

## 2020-10-21 ENCOUNTER — Encounter: Payer: Self-pay | Admitting: Family Medicine

## 2020-10-21 ENCOUNTER — Ambulatory Visit: Payer: BC Managed Care – PPO | Admitting: Family Medicine

## 2020-10-21 ENCOUNTER — Other Ambulatory Visit: Payer: Self-pay

## 2020-10-21 DIAGNOSIS — E669 Obesity, unspecified: Secondary | ICD-10-CM

## 2020-10-21 DIAGNOSIS — R109 Unspecified abdominal pain: Secondary | ICD-10-CM

## 2020-10-21 DIAGNOSIS — R7303 Prediabetes: Secondary | ICD-10-CM

## 2020-10-21 DIAGNOSIS — E785 Hyperlipidemia, unspecified: Secondary | ICD-10-CM

## 2020-10-21 MED ORDER — ATORVASTATIN CALCIUM 40 MG PO TABS: 40.0000 mg | ORAL_TABLET | Freq: Every day | ORAL | 3 refills | Status: DC

## 2020-10-21 NOTE — Assessment & Plan Note (Signed)
Suspect rib strain.  She will monitor as it has been improving.

## 2020-10-21 NOTE — Addendum Note (Signed)
Addended by: Ezequiel Ganser on: 10/21/2020 04:32 PM   Modules accepted: Orders

## 2020-10-21 NOTE — Assessment & Plan Note (Signed)
Check A1c. 

## 2020-10-21 NOTE — Progress Notes (Signed)
[name], MD Phone: [phone]  Margaret Stark is a 65 y.o. female who presents today for follow-up.  Obesity: Patient has been using [name] though notes her appetite and her will to work on her diet and exercise have not been affected.  She notes last summer she was doing well until she went back to school for work.  She notes its been very difficult as this year has been more stressful than in the past.  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  Hyperlipidemia: Taking Lipitor.  No chest pain, claudication, right upper quadrant pain, or myalgias.  Right side pain: Notes its not as bad as it was.  Social History   Tobacco Use  Smoking Status Never Smoker  Smokeless Tobacco Never Used    Current Outpatient Medications on File Prior to Visit  Medication Sig Dispense Refill  . BD PEN NEEDLE MICRO U/F 32G X 6 MM MISC USE ONCE DAILY WITH SAXENDA 100 each 1  . influenza vaccine (FLUCELVAX QUADRIVALENT) 0.5 ML injection Flucelvax Quad 2019-2020 (PF) 60 mcg (15 mcg x 4)/0.5 mL IM syringe  TO BE ADMINISTERED BY PHARMACIST FOR IMMUNIZATION    . Latanoprost 0.005 % EMUL latanoprost 0.005 % eye drops    . Vitamin D, Ergocalciferol, (DRISDOL) 1.25 MG (50000 UT) CAPS capsule ergocalciferol (vitamin D2) 1,250 mcg (50,000 unit) capsule    . nitroGLYCERIN (NITROSTAT) 0.4 MG SL tablet Place 1 tablet (0.4 mg total) under the tongue every 5 (five) minutes as needed for chest pain. 100 tablet 3   No current facility-administered medications on file prior to visit.     ROS see history of present illness  Objective  Physical Exam Vitals:   10/21/20 1614  BP: 120/70  Pulse: 80  Temp: 98.7 F (37.1 C)    BP Readings from Last 3 Encounters:  10/21/20 120/70  06/05/20 118/80  03/05/20 115/70   Wt Readings from Last 3 Encounters:  03/05/20 221 lb 3.2 oz (100.3 kg)  12/04/19 230 lb 9.6 oz (104.6 kg)  08/02/19 240 lb (108.9  kg)  Patient refused weight today.  Physical Exam Constitutional:      General: She is not in acute distress.    Appearance: She is not diaphoretic.  Cardiovascular:     Rate and Rhythm: Normal rate and regular rhythm.     Heart sounds: Normal heart sounds.  Pulmonary:     Effort: Pulmonary effort is normal.     Breath sounds: Normal breath sounds.  Musculoskeletal:       Arms:  Skin:    General: Skin is warm and dry.  Neurological:     Mental Status: She is alert.      Assessment/Plan: Please see individual problem list.  Problem List Items Addressed This Visit    Hyperlipidemia    Continue Lipitor 40 mg once daily.  Check labs.      Relevant Medications   atorvastatin (LIPITOR) 40 MG tablet   Other Relevant Orders   Comp Met (CMET)   Lipid panel   Obesity (BMI 35.0-39.9 without comorbidity)    Patient refuses a weight today.  We will have her discontinue Saxenda as it does not appear to be helping based on her description of excessive appetite.  Once she retires she will start back to exercising and she will intensively focus on her diet.  We will see her back in September to help figure out  if diet and exercise has been helpful.      Prediabetes    Check A1c.      Relevant Orders   HgB A1c   Right flank pain    Suspect rib strain.  She will monitor as it has been improving.         This visit occurred during the SARS-CoV-2 public health emergency.  Safety protocols were in place, including screening questions prior to the visit, additional usage of staff PPE, and extensive cleaning of exam room while observing appropriate contact time as indicated for disinfecting solutions.    [name], MD [place]

## 2020-10-21 NOTE — Assessment & Plan Note (Signed)
Patient refuses a weight today.  We will have her discontinue Saxenda as it does not appear to be helping based on her description of excessive appetite.  Once she retires she will start back to exercising and she will intensively focus on her diet.  We will see her back in September to help figure out if diet and exercise has been helpful.

## 2020-10-21 NOTE — Assessment & Plan Note (Addendum)
Continue Lipitor 40 mg once daily.  Check labs.

## 2020-10-21 NOTE — Patient Instructions (Signed)
Nice to see you. We will get labs today. Please start with diet and exercise as we discussed. Please discontinue your Saxenda.

## 2020-10-23 ENCOUNTER — Other Ambulatory Visit: Payer: Self-pay

## 2020-10-23 ENCOUNTER — Other Ambulatory Visit: Payer: BC Managed Care – PPO

## 2020-10-23 DIAGNOSIS — R7303 Prediabetes: Secondary | ICD-10-CM

## 2020-10-23 DIAGNOSIS — E785 Hyperlipidemia, unspecified: Secondary | ICD-10-CM

## 2020-11-06 IMAGING — DX DG WRIST COMPLETE 3+V*L*
4 series · 4 of 4 positions shown · non-contrast
Comparison: None.

CLINICAL DATA: Radial aspect bony prominence versus ganglion cyst

EXAM:
LEFT WRIST - COMPLETE 3+ VIEW

[wrist ap]
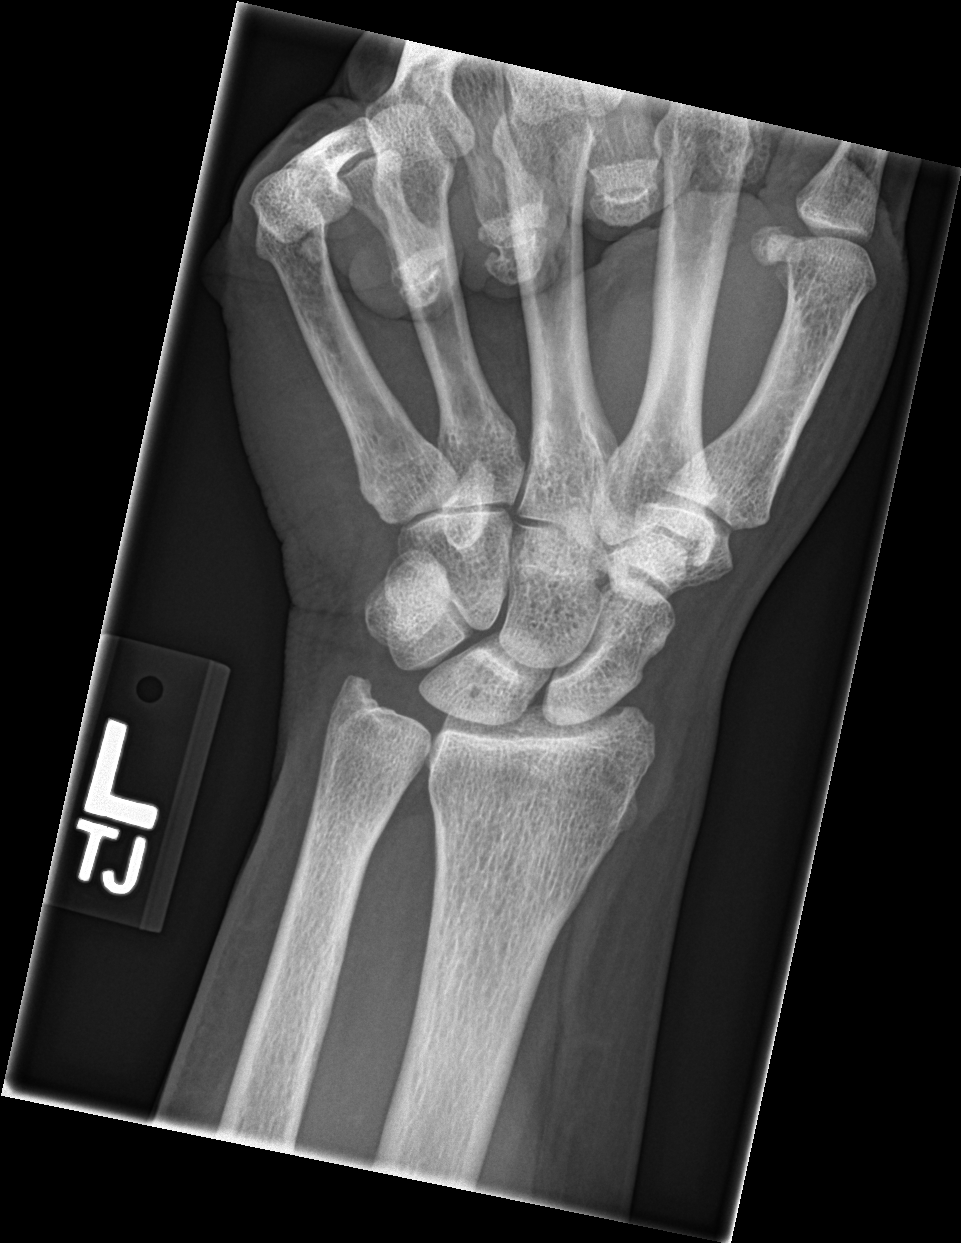

[wrist obl]
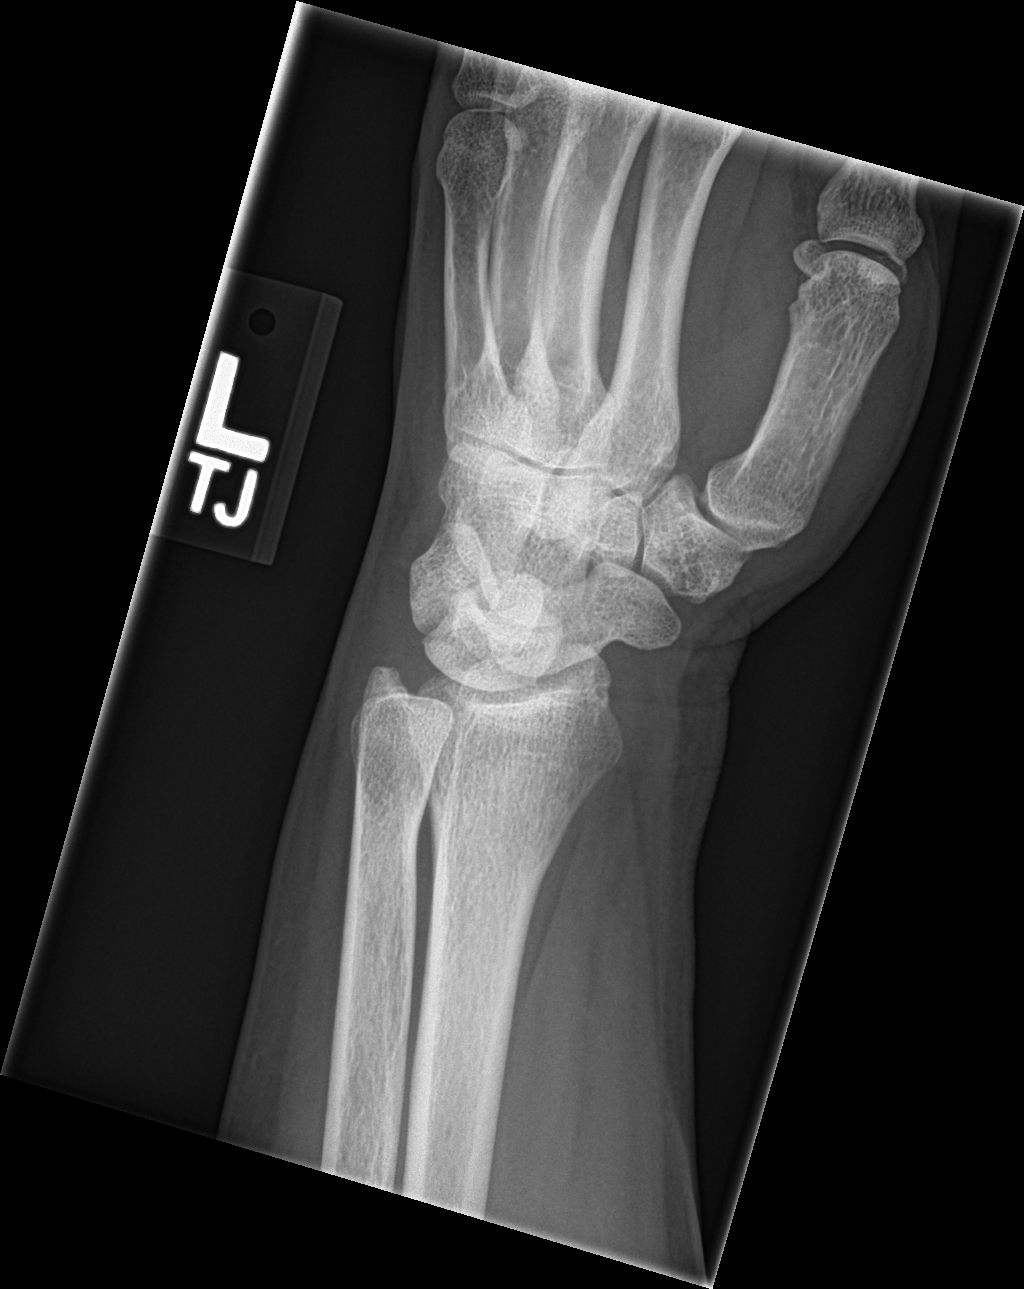

[wrist lat]
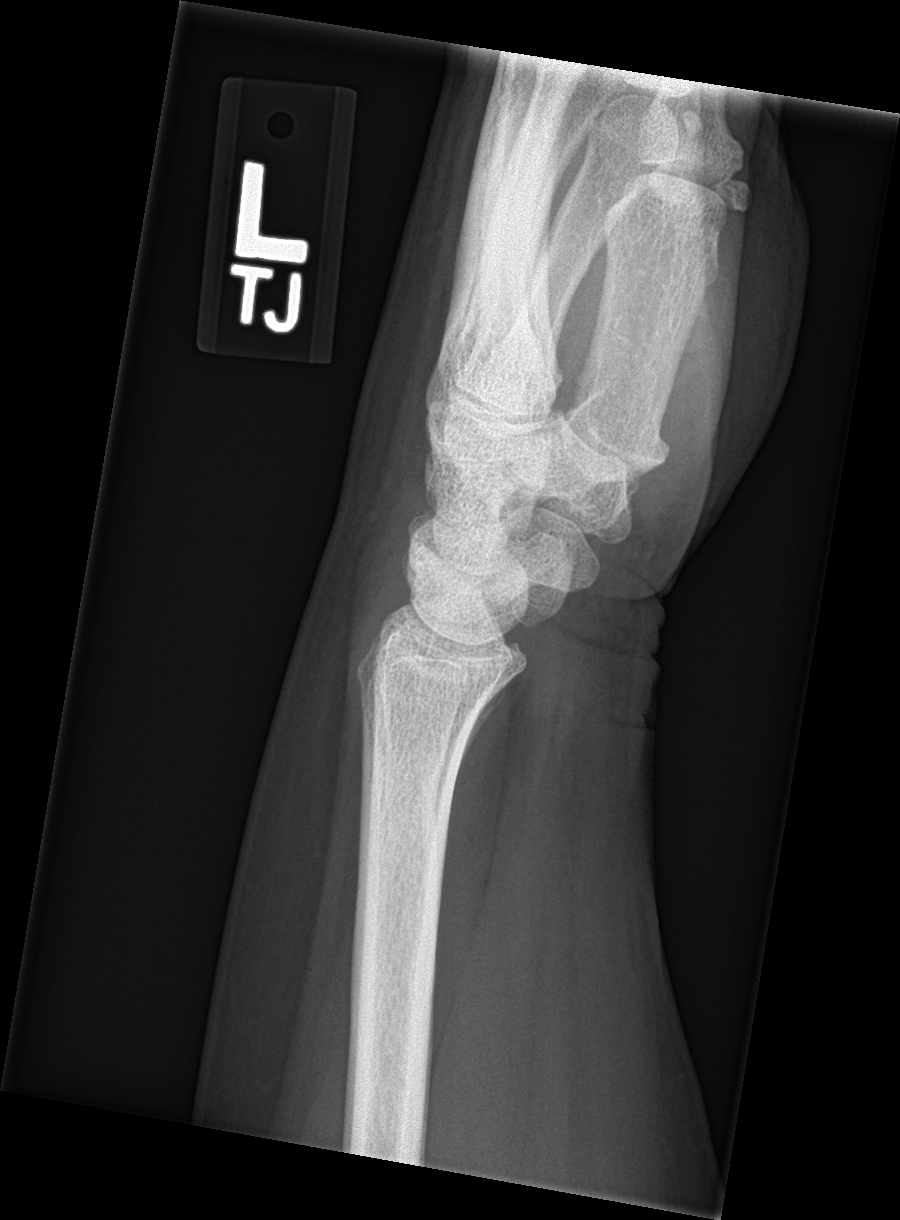

[wrist pa]
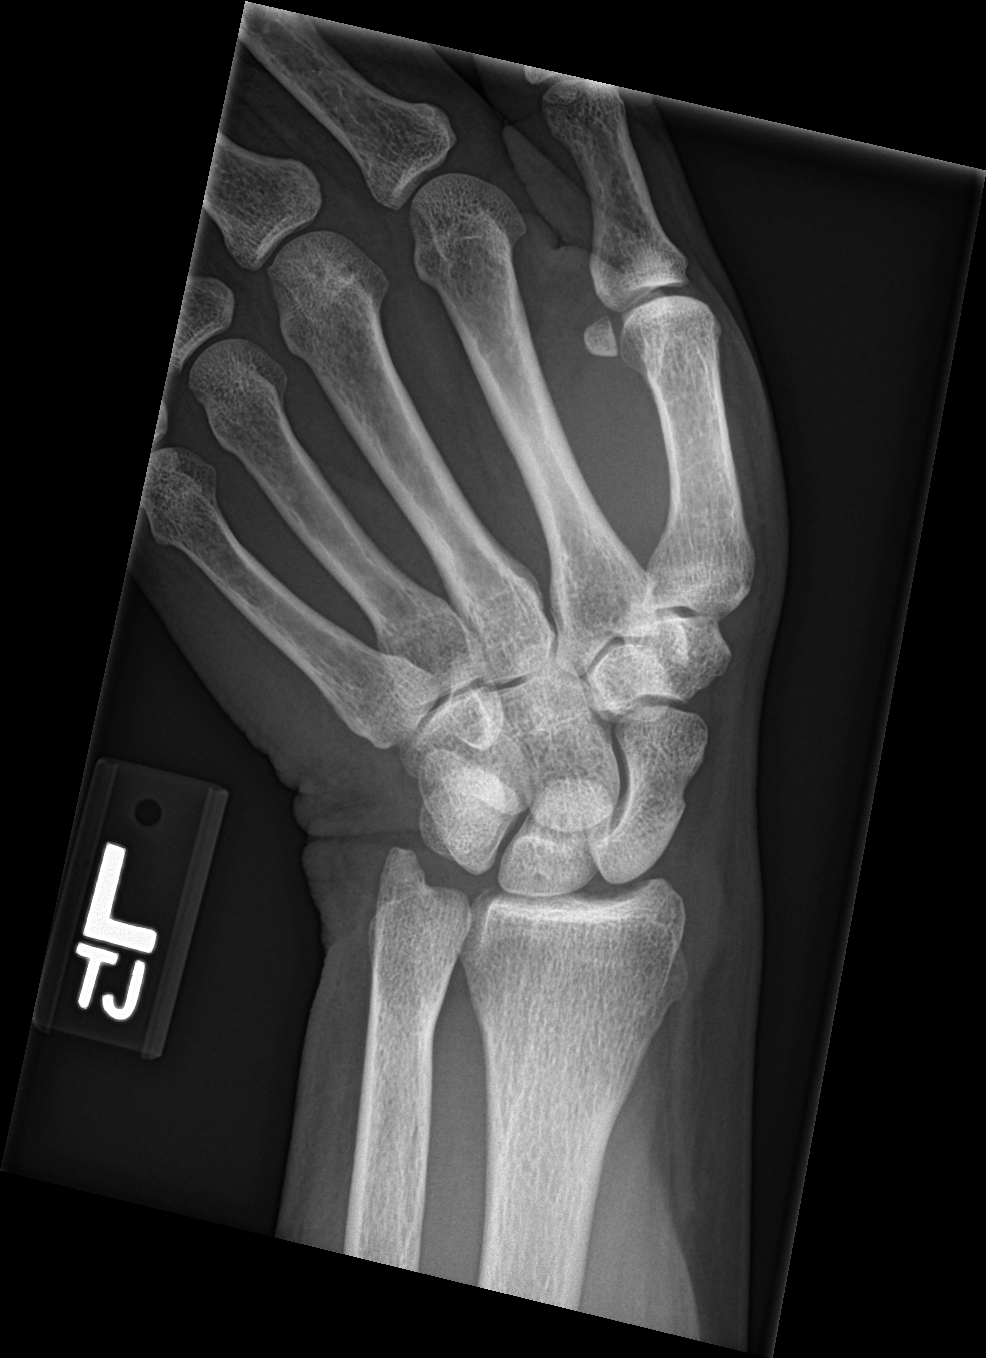

[4 of 4 positions shown; findings below may reference images not displayed]

FINDINGS: There is no evidence of fracture or dislocation. There is no
evidence of arthropathy or other focal bone abnormality. Soft tissue
thickening over the distal radius.
IMPRESSION: No acute osseous abnormality.  Negative for bony mass lesion.

## 2021-04-23 ENCOUNTER — Ambulatory Visit: Payer: BC Managed Care – PPO | Admitting: Family Medicine

## 2022-01-11 ENCOUNTER — Other Ambulatory Visit: Payer: Self-pay | Admitting: Family Medicine

## 2022-01-11 DIAGNOSIS — E785 Hyperlipidemia, unspecified: Secondary | ICD-10-CM

## 2022-07-03 LAB — HM MAMMOGRAPHY

## 2022-12-30 ENCOUNTER — Encounter: Payer: Self-pay | Admitting: Family Medicine

## 2022-12-30 ENCOUNTER — Ambulatory Visit: Payer: Medicare PPO | Admitting: Family Medicine

## 2022-12-30 VITALS — BP 126/80 | HR 84 | Temp 98.3°F | Ht 64.0 in | Wt 256.6 lb

## 2022-12-30 DIAGNOSIS — E785 Hyperlipidemia, unspecified: Secondary | ICD-10-CM

## 2022-12-30 DIAGNOSIS — Z780 Asymptomatic menopausal state: Secondary | ICD-10-CM

## 2022-12-30 DIAGNOSIS — R5383 Other fatigue: Secondary | ICD-10-CM | POA: Diagnosis not present

## 2022-12-30 DIAGNOSIS — R7303 Prediabetes: Secondary | ICD-10-CM | POA: Diagnosis not present

## 2022-12-30 LAB — LIPID PANEL
Cholesterol: 135 mg/dL (ref 0–200)
HDL: 39.1 mg/dL (ref >39.00)
LDL Cholesterol: 70 mg/dL (ref 0–99)
NonHDL: 96.3
Total CHOL/HDL Ratio: 3
Triglycerides: 134 mg/dL (ref 0.0–149.0)
VLDL: 26.8 mg/dL (ref 0.0–40.0)

## 2022-12-30 LAB — COMPREHENSIVE METABOLIC PANEL
ALT: 12 U/L (ref 0–35)
AST: 15 U/L (ref 0–37)
Albumin: 4.1 g/dL (ref 3.5–5.2)
Alkaline Phosphatase: 80 U/L (ref 39–117)
BUN: 10 mg/dL (ref 6–23)
CO2: 26 mEq/L (ref 19–32)
Calcium: 9.5 mg/dL (ref 8.4–10.5)
Chloride: 101 mEq/L (ref 96–112)
Creatinine, Ser: 0.99 mg/dL (ref 0.40–1.20)
GFR: 59.43 mL/min — ABNORMAL LOW (ref >60.00)
Glucose, Bld: 157 mg/dL — ABNORMAL HIGH (ref 70–99)
Potassium: 3.7 mEq/L (ref 3.5–5.1)
Sodium: 138 mEq/L (ref 135–145)
Total Bilirubin: 0.6 mg/dL (ref 0.2–1.2)
Total Protein: 7.1 g/dL (ref 6.0–8.3)

## 2022-12-30 LAB — VITAMIN D 25 HYDROXY (VIT D DEFICIENCY, FRACTURES): VITD: 33.01 ng/mL (ref 30.00–100.00)

## 2022-12-30 LAB — HEMOGLOBIN A1C: Hgb A1c MFr Bld: 6.9 % — ABNORMAL HIGH (ref 4.6–6.5)

## 2022-12-30 LAB — CBC WITH DIFFERENTIAL/PLATELET
Basophils Absolute: 0 10*3/uL (ref 0.0–0.1)
Basophils Relative: 0.8 % (ref 0.0–3.0)
Eosinophils Absolute: 0.1 10*3/uL (ref 0.0–0.7)
Eosinophils Relative: 3.1 % (ref 0.0–5.0)
HCT: 37.4 % (ref 36.0–46.0)
Hemoglobin: 12 g/dL (ref 12.0–15.0)
Lymphocytes Relative: 39 % (ref 12.0–46.0)
Lymphs Abs: 1.8 10*3/uL (ref 0.7–4.0)
MCHC: 32 g/dL (ref 30.0–36.0)
MCV: 87 fl (ref 78.0–100.0)
Monocytes Absolute: 0.3 10*3/uL (ref 0.1–1.0)
Monocytes Relative: 5.9 % (ref 3.0–12.0)
Neutro Abs: 2.4 10*3/uL (ref 1.4–7.7)
Neutrophils Relative %: 51.2 % (ref 43.0–77.0)
Platelets: 303 10*3/uL (ref 150.0–400.0)
RBC: 4.3 Mil/uL (ref 3.87–5.11)
RDW: 13.7 % (ref 11.5–15.5)
WBC: 4.7 10*3/uL (ref 4.0–10.5)

## 2022-12-30 LAB — TSH: TSH: 1.7 u[IU]/mL (ref 0.35–5.50)

## 2022-12-30 LAB — VITAMIN B12: Vitamin B-12: 140 pg/mL — ABNORMAL LOW (ref 211–911)

## 2022-12-30 NOTE — Assessment & Plan Note (Signed)
Chronic issue.  Check labs.  Continue Lipitor 40 mg daily.

## 2022-12-30 NOTE — Assessment & Plan Note (Signed)
Patient has fatigue and lack of energy.  This may just be related to inactivity.  Will check lab work as outlined to look for other causes.

## 2022-12-30 NOTE — Progress Notes (Signed)
Marikay Alar, MD Phone: [phone]  Margaret Stark is a 67 y.o. female who presents today for f/u.  Hyperlipidemia: Patient has no chest pain, shortness of breath, edema, right upper quadrant pain, or myalgias.  She is on Lipitor.  Obesity: Patient notes she lacks energy and motivation to be able to start exercising.  She notes she will eat right for 2 to 3 days and then goes back to eating junk food because it makes her feel better.  She notes no depression or anxiety.  She does report her father passed away and that had been difficult.  She lost focus after that.  He passed away last year.     01-07-2023   10:59 AM 10/21/2020    4:18 PM 03/05/2020   11:06 AM 12/04/2019    4:00 PM 06/05/2019   11:27 AM  Depression screen PHQ 2/9  Decreased Interest 0 0 0 0 0  Down, Depressed, Hopeless 0 0 0 0 0  PHQ - 2 Score 0 0 0 0 0  Altered sleeping 0      Tired, decreased energy 3      Change in appetite 2      Trouble concentrating 1      Moving slowly or fidgety/restless 0      Suicidal thoughts 0      PHQ-9 Score 6      Difficult doing work/chores Not difficult at all         Social History   Tobacco Use  Smoking Status Never  Smokeless Tobacco Never    Current Outpatient Medications on File Prior to Visit  Medication Sig Dispense Refill   aspirin EC 81 MG tablet Take 81 mg by mouth daily. Swallow whole.     atorvastatin (LIPITOR) 40 MG tablet TAKE 1 TABLET BY MOUTH EVERY DAY 90 tablet 3   BD PEN NEEDLE MICRO U/F 32G X 6 MM MISC USE ONCE DAILY WITH SAXENDA 100 each 1   Latanoprost 0.005 % EMUL latanoprost 0.005 % eye drops     Vitamin D, Ergocalciferol, (DRISDOL) 1.25 MG (50000 UT) CAPS capsule ergocalciferol (vitamin D2) 1,250 mcg (50,000 unit) capsule     No current facility-administered medications on file prior to visit.     ROS see history of present illness  Objective  Physical Exam Vitals:   Jan 07, 2023 1057 07-Jan-2023 1118  BP: 132/84 126/80  Pulse: 84    Temp: 98.3 F (36.8 C)   SpO2: 98%     BP Readings from Last 3 Encounters:  01/07/2023 126/80  10/21/20 120/70  06/05/20 118/80   Wt Readings from Last 3 Encounters:  Jan 07, 2023 256 lb 9.6 oz (116.4 kg)  03/05/20 221 lb 3.2 oz (100.3 kg)  12/04/19 230 lb 9.6 oz (104.6 kg)    Physical Exam Constitutional:      General: She is not in acute distress.    Appearance: She is not diaphoretic.  Cardiovascular:     Rate and Rhythm: Normal rate and regular rhythm.     Heart sounds: Normal heart sounds.  Pulmonary:     Effort: Pulmonary effort is normal.     Breath sounds: Normal breath sounds.  Skin:    General: Skin is warm and dry.  Neurological:     Mental Status: She is alert.      Assessment/Plan: Please see individual problem list.  Hyperlipidemia, unspecified hyperlipidemia type Assessment & Plan: Chronic issue.  Check labs.  Continue Lipitor 40 mg daily.  Orders: -  Comprehensive metabolic panel -     Lipid panel  Prediabetes -     Hemoglobin A1c  Postmenopausal estrogen deficiency -     HM DEXA SCAN; Future  Morbid obesity (HCC) Assessment & Plan: Chronic issue.  Weight has trended up.  Discussed the need for exercise.  Discussed walking 3 days a week.  Discussed eating a healthy diet.  Advised Medicare does not pay for weight loss medications.  Discussed follow-up in 3 months.   Other fatigue Assessment & Plan: Patient has fatigue and lack of energy.  This may just be related to inactivity.  Will check lab work as outlined to look for other causes.  Orders: -     CBC with Differential/Platelet -     TSH -     VITAMIN D 25 Hydroxy (Vit-D Deficiency, Fractures) -     Vitamin B12     Health Maintenance: requesting mammogram from GYN. Patient will call to schedule bone density test.   Return in about 3 months (around 04/01/2023) for Weight follow-up.   Marikay Alar, MD [facility]

## 2022-12-30 NOTE — Patient Instructions (Signed)
Nice to see you. Please try to start walking for exercise 3 days a week.  Please try to work on healthy diet. Please call [phone] to schedule your bone density scan. We will get lab work today and contact you with the results.

## 2022-12-30 NOTE — Assessment & Plan Note (Signed)
Chronic issue.  Weight has trended up.  Discussed the need for exercise.  Discussed walking 3 days a week.  Discussed eating a healthy diet.  Advised Medicare does not pay for weight loss medications.  Discussed follow-up in 3 months.

## 2023-01-01 ENCOUNTER — Other Ambulatory Visit: Payer: Self-pay | Admitting: Family Medicine

## 2023-01-01 DIAGNOSIS — E538 Deficiency of other specified B group vitamins: Secondary | ICD-10-CM

## 2023-01-06 ENCOUNTER — Other Ambulatory Visit: Payer: Self-pay | Admitting: Family

## 2023-01-06 MED ORDER — METFORMIN HCL 500 MG PO TABS
500.0000 mg | ORAL_TABLET | Freq: Two times a day (BID) | ORAL | 3 refills | Status: DC
Start: 2023-01-06 — End: 2023-07-09

## 2023-01-07 ENCOUNTER — Other Ambulatory Visit (INDEPENDENT_AMBULATORY_CARE_PROVIDER_SITE_OTHER): Payer: Medicare PPO

## 2023-01-07 ENCOUNTER — Ambulatory Visit (INDEPENDENT_AMBULATORY_CARE_PROVIDER_SITE_OTHER): Payer: Medicare PPO

## 2023-01-07 DIAGNOSIS — E538 Deficiency of other specified B group vitamins: Secondary | ICD-10-CM | POA: Diagnosis not present

## 2023-01-07 MED ORDER — CYANOCOBALAMIN 1000 MCG/ML IJ SOLN
1000.0000 ug | Freq: Once | INTRAMUSCULAR | Status: AC
Start: 2023-01-07 — End: 2023-01-07
  Administered 2023-01-07: 1000 ug via INTRAMUSCULAR

## 2023-01-07 NOTE — Progress Notes (Signed)
Patient arrived for a B12 injection and it was administered into her left deltoid. Patient tolerated the injection well and did not show any signs of distress or voice any concerns. 

## 2023-01-11 LAB — INTRINSIC FACTOR ANTIBODIES: Intrinsic Factor: NEGATIVE

## 2023-01-14 ENCOUNTER — Ambulatory Visit (INDEPENDENT_AMBULATORY_CARE_PROVIDER_SITE_OTHER): Payer: Medicare PPO | Admitting: *Deleted

## 2023-01-14 DIAGNOSIS — E538 Deficiency of other specified B group vitamins: Secondary | ICD-10-CM

## 2023-01-14 MED ORDER — CYANOCOBALAMIN 1000 MCG/ML IJ SOLN
1000.0000 ug | Freq: Once | INTRAMUSCULAR | Status: AC
Start: 2023-01-14 — End: 2023-01-14
  Administered 2023-01-14: 1000 ug via INTRAMUSCULAR

## 2023-01-14 NOTE — Progress Notes (Signed)
Pt received B12 injection in right deltoid muscle. Pt tolerated it well with no complaints or concerns.

## 2023-01-19 ENCOUNTER — Telehealth: Payer: Self-pay

## 2023-01-19 DIAGNOSIS — Z780 Asymptomatic menopausal state: Secondary | ICD-10-CM

## 2023-01-19 NOTE — Telephone Encounter (Signed)
Patient states she saw Dr. Marikay Alar on 12/30/2022, and he wanted her to have a Dexa scan.  Patient states she has called the number he gave her to schedule and appointment for the scan and they do not have an order from Dr. Birdie Sons.

## 2023-01-21 ENCOUNTER — Ambulatory Visit (INDEPENDENT_AMBULATORY_CARE_PROVIDER_SITE_OTHER): Payer: Medicare PPO | Admitting: *Deleted

## 2023-01-21 DIAGNOSIS — E538 Deficiency of other specified B group vitamins: Secondary | ICD-10-CM

## 2023-01-21 MED ORDER — CYANOCOBALAMIN 1000 MCG/ML IJ SOLN
1000.0000 ug | Freq: Once | INTRAMUSCULAR | Status: AC
Start: 2023-01-21 — End: 2023-01-21
  Administered 2023-01-21: 1000 ug via INTRAMUSCULAR

## 2023-01-21 NOTE — Progress Notes (Signed)
Pt received B12 injection in Left  deltoid muscle. Pt tolerated it well with no complaints or concerns.  

## 2023-01-21 NOTE — Telephone Encounter (Signed)
Order in please co-sign

## 2023-01-21 NOTE — Telephone Encounter (Signed)
I have cosigned it though there was already an order from 6/5 in place.

## 2023-01-23 ENCOUNTER — Other Ambulatory Visit: Payer: Self-pay | Admitting: Family

## 2023-01-23 DIAGNOSIS — E785 Hyperlipidemia, unspecified: Secondary | ICD-10-CM

## 2023-02-01 ENCOUNTER — Other Ambulatory Visit: Payer: Self-pay

## 2023-02-01 ENCOUNTER — Telehealth: Payer: Self-pay | Admitting: Family Medicine

## 2023-02-01 DIAGNOSIS — E785 Hyperlipidemia, unspecified: Secondary | ICD-10-CM

## 2023-02-01 MED ORDER — ATORVASTATIN CALCIUM 40 MG PO TABS
40.0000 mg | ORAL_TABLET | Freq: Every day | ORAL | 3 refills | Status: DC
Start: 2023-02-01 — End: 2024-05-05

## 2023-02-01 NOTE — Telephone Encounter (Signed)
Prescription sent to [facility]

## 2023-02-01 NOTE — Telephone Encounter (Signed)
Prescription Request  02/01/2023  LOV: 12/30/2022  What is the name of the medication or equipment? atorvastatin   Have you contacted your pharmacy to request a refill? No   Which pharmacy would you like this [facility]? [facility] Patient notified that their request is being sent to the clinical staff for review and that they should receive a response within 2 business days.   Please advise at Mobile [phone] (mobile)

## 2023-02-03 ENCOUNTER — Ambulatory Visit
Admission: RE | Admit: 2023-02-03 | Discharge: 2023-02-03 | Disposition: A | Discharge status: Home / Self Care | Payer: Medicare PPO | Source: Ambulatory Visit | Attending: Family Medicine | Admitting: Family Medicine

## 2023-02-03 DIAGNOSIS — Z780 Asymptomatic menopausal state: Secondary | ICD-10-CM | POA: Diagnosis present

## 2023-02-23 ENCOUNTER — Ambulatory Visit: Payer: Medicare PPO

## 2023-02-23 DIAGNOSIS — E538 Deficiency of other specified B group vitamins: Secondary | ICD-10-CM | POA: Diagnosis not present

## 2023-02-23 MED ORDER — CYANOCOBALAMIN 1000 MCG/ML IJ SOLN
1000.0000 ug | Freq: Once | INTRAMUSCULAR | Status: AC
Start: 2023-02-23 — End: 2023-02-23
  Administered 2023-02-23: 1000 ug via INTRAMUSCULAR

## 2023-02-23 NOTE — Progress Notes (Signed)
Patient arrived for a B12 injection and it was administered into her right deltoid. Patient tolerated the injection well and did not show any signs of distress or voice any concerns. 

## 2023-03-10 ENCOUNTER — Ambulatory Visit (INDEPENDENT_AMBULATORY_CARE_PROVIDER_SITE_OTHER): Payer: Medicare PPO | Admitting: Emergency Medicine

## 2023-03-10 VITALS — Ht 64.0 in | Wt 254.0 lb

## 2023-03-10 DIAGNOSIS — R7303 Prediabetes: Secondary | ICD-10-CM

## 2023-03-10 DIAGNOSIS — Z0000 Encounter for general adult medical examination without abnormal findings: Secondary | ICD-10-CM

## 2023-03-10 NOTE — Progress Notes (Signed)
Subjective:   Margaret Stark is a 67 y.o. female who presents for an Initial Medicare Annual Wellness Visit.  Visit Complete: Virtual  I connected with  Margaret Stark on 03/10/23 by a audio enabled telemedicine application and verified that I am speaking with the correct person using two identifiers.  Patient Location: Home  Provider Location: Home Office  I discussed the limitations of evaluation and management by telemedicine. The patient expressed understanding and agreed to proceed.  Patient Medicare AWV questionnaire was completed by the patient on 03/08/23; I have confirmed that all information answered by patient is correct and no changes since this date.  Vital Signs: Unable to obtain new vitals due to this being a telehealth visit. Patient reported height and weight.  Review of Systems     Cardiac Risk Factors include: advanced age (>11men, >15 women);obesity (BMI >30kg/m2);dyslipidemia;Other (see comment), Risk factor comments: pre-diabeters     Objective:    Today's Vitals   03/10/23 1240  Weight: 254 lb (115.2 kg)  Height: 5\' 4"  (1.626 m)   Body mass index is 43.6 kg/m.     03/10/2023   12:58 PM 11/24/2018    9:31 AM 02/14/2016   11:04 AM 07/15/2015    2:16 PM  Advanced Directives  Does Patient Have a Medical Advance Directive? No No No No  Would patient like information on creating a medical advance directive? Yes (MAU/Ambulatory/Procedural Areas - Information given)  No - patient declined information     Current Medications (verified) Outpatient Encounter Medications as of 03/10/2023  Medication Sig   aspirin EC 81 MG tablet Take 81 mg by mouth daily. Swallow whole.   atorvastatin (LIPITOR) 40 MG tablet Take 1 tablet (40 mg total) by mouth daily.   diclofenac Sodium (VOLTAREN) 1 % GEL Apply 2 g topically daily as needed (knee pain).   Latanoprost 0.005 % EMUL latanoprost 0.005 % eye drops   metFORMIN (GLUCOPHAGE) 500 MG tablet Take 1 tablet (500 mg  total) by mouth 2 (two) times daily with a meal.   Vitamin D, Ergocalciferol, (DRISDOL) 1.25 MG (50000 UT) CAPS capsule ergocalciferol (vitamin D2) 1,250 mcg (50,000 unit) capsule   BD PEN NEEDLE MICRO U/F 32G X 6 MM MISC USE ONCE DAILY WITH SAXENDA (Patient not taking: Reported on 03/10/2023)   No facility-administered encounter medications on file as of 03/10/2023.    Allergies (verified) Patient has no known allergies.   History: Past Medical History:  Diagnosis Date   Arthritis    CAD (coronary artery disease)    a. LHC 11/24/2018: mild non-obstructive CAD with 30% stenosis of proximal LAD and 10% stenosis of  proximal to mid RCA   Past Surgical History:  Procedure Laterality Date   COLONOSCOPY WITH PROPOFOL N/A 07/15/2015   Procedure: COLONOSCOPY WITH PROPOFOL;  Surgeon: Wallace Cullens, MD;  Location: [facility] ENDOSCOPY;  Service: Gastroenterology;  Laterality: N/A;   LEFT HEART CATH AND CORONARY ANGIOGRAPHY N/A 11/24/2018   Procedure: LEFT HEART CATH AND CORONARY ANGIOGRAPHY;  Surgeon: Kathleene Hazel, MD;  Location: [facility] INVASIVE CV LAB;  Service: Cardiovascular;  Laterality: N/A;   Family History  Problem Relation Age of Onset   Hypertension Mother    Hypertension Father    Cancer Sister        colon - age 63   Heart disease Neg Hx    Social History   Socioeconomic History   Marital status: Married    Spouse name: Johnnie   Number of children:  0   Years of education: Not on file   Highest education level: Master's degree (e.g., MA, MS, MEng, MEd, MSW, MBA)  Occupational History   Occupation: retired Runner, broadcasting/film/video, 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  Retired Engineer, site, but still works part time Engineer, manufacturing   Social Determinants of Corporate investment banker Strain: Low Risk  (03/08/2023)   Overall Financial Resource Strain (CARDIA)    Difficulty of Paying Living Expenses: Not hard at all  Food Insecurity: No Food Insecurity (03/08/2023)   Hunger Vital Sign    Worried About Running Out of Food in the Last Year: Never true    Ran Out of Food in the Last Year: Never true  Transportation Needs: No Transportation Needs (03/08/2023)   PRAPARE - Administrator, Civil Service (Medical): No    Lack of Transportation (Non-Medical): No  Physical Activity: Insufficiently Active (03/08/2023)   Exercise Vital Sign    Days of Exercise per Week: 1 day    Minutes of Exercise per Session: 30 min  Stress: No Stress Concern Present (03/08/2023)   [employer] of Occupational Health - Occupational Stress Questionnaire    Feeling of Stress : Not at all  Social Connections: Socially Integrated (03/08/2023)   Social Connection and Isolation Panel [NHANES]    Frequency of Communication with Friends and Family: More than three times a week    Frequency of Social Gatherings with Friends and Family: Three times a week    Attends Religious Services: More than 4 times per year    Active Member of Clubs or Organizations: Yes    Attends Engineer, structural: More than 4 times per year    Marital Status: Married    Tobacco Counseling Counseling given: Not Answered   Clinical Intake:  Pre-visit preparation completed: Yes  Pain : No/denies pain     BMI - recorded: 43.6 Nutritional Status: BMI > 30  Obese Nutritional Risks: None Diabetes: No (pre-diabetes) CBG done?: No Did pt. bring in CBG monitor from home?: No  How often do you need to have someone help you when you read instructions, pamphlets, or other written materials from your doctor or pharmacy?: 1 - Never  Interpreter Needed?: No  Information entered  by :: Tora Kindred, CMA   Activities of Daily Living    03/08/2023    1:54 PM  In your present state of health, do you have any difficulty performing the following activities:  Hearing? 0  Vision? 0  Difficulty concentrating or making decisions? 0  Walking or climbing stairs? 0  Dressing or bathing? 0  Doing errands, shopping? 0  Preparing Food and eating ? N  Using the Toilet? N  In the past six months, have you accidently leaked urine? Y  Comment wears a pad  Do you have problems with loss of bowel control? N  Managing your Medications? N  Managing your Finances? N  Housekeeping or managing your Housekeeping? N    Patient Care Team: Glori Luis, MD as PCP - General (Family Medicine)  Indicate any recent Medical  Services you may have received from other than [facility] providers in the past year (date may be approximate).     Assessment:   This is a routine wellness examination for Arlys.  Hearing/Vision screen Hearing Screening - Comments:: Denies hearing loss Vision Screening - Comments:: Gets routine eye exams  Dietary issues and exercise activities discussed:     Goals Addressed               This Visit's Progress     Exercise 3x per week (30 min per time) (pt-stated)        Exercise and lose weight      Depression Screen    03/10/2023   12:56 PM 12/30/2022   10:59 AM 10/21/2020    4:18 PM 03/05/2020   11:06 AM 12/04/2019    4:00 PM 06/05/2019   11:27 AM 05/10/2019    4:20 PM  PHQ 2/9 Scores  PHQ - 2 Score 0 0 0 0 0 0 0  PHQ- 9 Score 0 6         Fall Risk    03/08/2023    1:54 PM 12/30/2022   10:59 AM 10/21/2020    4:18 PM 03/05/2020   11:06 AM 12/04/2019    3:59 PM  Fall Risk   Falls in the past year? 1 0 0 0 0  Number falls in past yr: 0 0 0 0 0  Injury with Fall? 0 0     Risk for fall due to : History of fall(s) No Fall Risks     Follow up Falls prevention discussed;Falls evaluation completed Falls evaluation completed Falls evaluation  completed Falls evaluation completed Falls evaluation completed    MEDICARE RISK AT HOME:   TIMED UP AND GO:  Was the test performed? No    Cognitive Function:        03/10/2023    1:00 PM  6CIT Screen  What Year? 0 points  What month? 0 points  What time? 0 points  Count back from 20 0 points  Months in reverse 0 points  Repeat phrase 0 points  Total Score 0 points    Immunizations Immunization History  Administered Date(s) Administered   Influenza Inj Mdck Quad Pf 08/13/2018   Influenza,inj,Quad PF,6+ Mos 06/19/2015, 04/22/2019, 06/05/2020   Influenza-Unspecified 06/19/2015, 05/09/2016, 08/13/2018   Moderna Covid-19 Vaccine Bivalent Booster 60yrs & up 04/03/2021   Moderna Sars-Covid-2 Vaccination 10/31/2020   PFIZER(Purple Top)SARS-COV-2 Vaccination 06/30/2020   Td 06/05/2019   Tdap 08/25/2021    TDAP status: Up to date  Flu Vaccine status: Due, Education has been provided regarding the importance of this vaccine. Advised may receive this vaccine at local pharmacy or Health Dept. Aware to provide a copy of the vaccination record if obtained from local pharmacy or Health Dept. Verbalized acceptance and understanding.  Pneumococcal vaccine status: Due, Education has been provided regarding the importance of this vaccine. Advised may receive this vaccine at local pharmacy or Health Dept. Aware to provide a copy of the vaccination record if obtained from local pharmacy or Health Dept. Verbalized acceptance and understanding.  Covid-19 vaccine status: Information provided on how to obtain vaccines.   Qualifies for Shingles Vaccine? Yes   Zostavax completed No   Shingrix Completed?: No.    Education has been provided regarding the importance of this vaccine. Patient has been advised to call insurance company to determine out of pocket expense if they have not yet received this vaccine. Advised may also receive vaccine at  local pharmacy or Health Dept. Verbalized acceptance  and understanding.  Screening Tests Health Maintenance  Topic Date Due   Hepatitis C Screening  Never done   Zoster Vaccines- Shingrix (1 of 2) Never done   MAMMOGRAM  03/28/2016   Pneumonia Vaccine 44+ Years old (1 of 1 - PCV) Never done   COVID-19 Vaccine (4 - 2023-24 season) 03/27/2022   INFLUENZA VACCINE  02/25/2023   Colonoscopy  01/23/2024   Medicare Annual Wellness (AWV)  03/09/2024   DTaP/Tdap/Td (3 - Td or Tdap) 08/26/2031   DEXA SCAN  02/02/2033   HPV VACCINES  Aged Out    Health Maintenance  Health Maintenance Due  Topic Date Due   Hepatitis C Screening  Never done   Zoster Vaccines- Shingrix (1 of 2) Never done   MAMMOGRAM  03/28/2016   Pneumonia Vaccine 57+ Years old (1 of 1 - PCV) Never done   COVID-19 Vaccine (4 - 2023-24 season) 03/27/2022   INFLUENZA VACCINE  02/25/2023    Colorectal cancer screening: Type of screening: Colonoscopy. Completed 01/23/19. Repeat every 5 years  Mammogram status: Ordered due 06/2023. Pt provided with contact info and advised to call to schedule appt.   Bone Density status: Completed 02/03/23. Results reflect: Bone density results: NORMAL. Repeat every 10 years.  Lung Cancer Screening: (Low Dose CT Chest recommended if Age 64-80 years, 20 pack-year currently smoking OR have quit w/in 15years.) does not qualify.   Lung Cancer Screening Referral: n/a  Additional Screening:  Hepatitis C Screening: does qualify; Completed Will draw with next labs.  Vision Screening: Recommended annual ophthalmology exams for early detection of glaucoma and other disorders of the eye. Dental Screening: Recommended annual dental exams for proper oral hygiene   Community Resource Referral / Chronic Care Management: CRR required this visit?  No   CCM required this visit?  No     Plan:     I have personally reviewed and noted the following in the patient's chart:   Medical and social history Use of alcohol, tobacco or illicit drugs   Current medications and supplements including opioid prescriptions. Patient is not currently taking opioid prescriptions. Functional ability and status Nutritional status Physical activity Advanced directives List of other physicians Hospitalizations, surgeries, and ER visits in previous 12 months Vitals Screenings to include cognitive, depression, and falls Referrals and appointments  In addition, I have reviewed and discussed with patient certain preventive protocols, quality metrics, and best practice recommendations. A written personalized care plan for preventive services as well as general preventive health recommendations were provided to patient.     Tora Kindred, CMA   03/10/2023   After Visit Summary: (MyChart) Due to this being a telephonic visit, the after visit summary with patients personalized plan was offered to patient via MyChart   Nurse Notes:  Referral place for diabetes & nutrition education for pre-diabetes and obesity. Will get flu shot in the fall. Last MMG 06/2022 per patient at [facility]. Requested records. Needs Hep C screening with next labs. Needs pneumonia and shingles vaccines. Needs covid booster when available.

## 2023-03-10 NOTE — Patient Instructions (Addendum)
Margaret Stark , Thank you for taking time to come for your Medicare Wellness Visit. I appreciate your ongoing commitment to your health goals. Please review the following plan we discussed and let me know if I can assist you in the future.   Referrals/Orders/Follow-Ups/Clinician Recommendations: I have placed a referral to diabetes & nutrition education. They should be calling you to schedule an appointment. Get your flu shot this fall. Recommend Covid booster when available. Recommend Shingles and pneumonia vaccines. I have requested records for your most recent mammogram from [facility]. Get the Hep C screening with next lab appointment.  This is a list of the screening recommended for you and due dates:  Health Maintenance  Topic Date Due   Hepatitis C Screening  Never done   Zoster (Shingles) Vaccine (1 of 2) Never done   Mammogram  03/28/2016   Pneumonia Vaccine (1 of 1 - PCV) Never done   COVID-19 Vaccine (4 - 2023-24 season) 03/27/2022   Flu Shot  02/25/2023   Colon Cancer Screening  01/23/2024   Medicare Annual Wellness Visit  03/09/2024   DTaP/Tdap/Td vaccine (3 - Td or Tdap) 08/26/2031   DEXA scan (bone density measurement)  02/02/2033   HPV Vaccine  Aged Out    Advanced directives: (ACP Link)Information on Advanced Care Planning can be found at [facility] Advance Health Care Directives Advance Health Care Directives ([email or web address]   Next Medicare Annual Wellness Visit scheduled for next year: Yes, 03/15/24 @ 12:45pm  Preventive Care 65 Years and Older, Female Preventive care refers to lifestyle choices and visits with your health care provider that can promote health and wellness. What does preventive care include? A yearly physical exam. This is also called an annual well check. Dental exams once or twice a year. Routine eye exams. Ask your health care provider how often you should have your eyes checked. Personal lifestyle choices,  including: Daily care of your teeth and gums. Regular physical activity. Eating a healthy diet. Avoiding tobacco and drug use. Limiting alcohol use. Practicing safe sex. Taking low-dose aspirin every day. Taking vitamin and mineral supplements as recommended by your health care provider. What happens during an annual well check? The services and screenings done by your health care provider during your annual well check will depend on your age, overall health, lifestyle risk factors, and family history of disease. Counseling  Your health care provider may ask you questions about your: Alcohol use. Tobacco use. Drug use. Emotional well-being. Home and relationship well-being. Sexual activity. Eating habits. History of falls. Memory and ability to understand (cognition). Work and work Astronomer. Reproductive health. Screening  You may have the following tests or measurements: Height, weight, and BMI. Blood pressure. Lipid and cholesterol levels. These may be checked every 5 years, or more frequently if you are over 16 years old. Skin check. Lung cancer screening. You may have this screening every year starting at age 32 if you have a 30-pack-year history of smoking and currently smoke or have quit within the past 15 years. Fecal occult blood test (FOBT) of the stool. You may have this test every year starting at age 36. Flexible sigmoidoscopy or colonoscopy. You may have a sigmoidoscopy every 5 years or a colonoscopy every 10 years starting at age 57. Hepatitis C blood test. Hepatitis B blood test. Sexually transmitted disease (STD) testing. Diabetes screening. This is done by checking your blood sugar (glucose) after you have not eaten for a while (fasting). You  may have this done every 1-3 years. Bone density scan. This is done to screen for osteoporosis. You may have this done starting at age 21. Mammogram. This may be done every 1-2 years. Talk to your health care provider  about how often you should have regular mammograms. Talk with your health care provider about your test results, treatment options, and if necessary, the need for more tests. Vaccines  Your health care provider may recommend certain vaccines, such as: Influenza vaccine. This is recommended every year. Tetanus, diphtheria, and acellular pertussis (Tdap, Td) vaccine. You may need a Td booster every 10 years. Zoster vaccine. You may need this after age 34. Pneumococcal 13-valent conjugate (PCV13) vaccine. One dose is recommended after age 75. Pneumococcal polysaccharide (PPSV23) vaccine. One dose is recommended after age 4. Talk to your health care provider about which screenings and vaccines you need and how often you need them. This information is not intended to replace advice given to you by your health care provider. Make sure you discuss any questions you have with your health care provider. Document Released: 08/09/2015 Document Revised: 04/01/2016 Document Reviewed: 05/14/2015 Elsevier Interactive Patient Education  2017 [employer].  Fall Prevention in the Home Falls can cause injuries. They can happen to people of all ages. There are many things you can do to make your home safe and to help prevent falls. What can I do on the outside of my home? Regularly fix the edges of walkways and driveways and fix any cracks. Remove anything that might make you trip as you walk through a door, such as a raised step or threshold. Trim any bushes or trees on the path to your home. Use bright outdoor lighting. Clear any walking paths of anything that might make someone trip, such as rocks or tools. Regularly check to see if handrails are loose or broken. Make sure that both sides of any steps have handrails. Any raised decks and porches should have guardrails on the edges. Have any leaves, snow, or ice cleared regularly. Use sand or salt on walking paths during winter. Clean up any spills in  your garage right away. This includes oil or grease spills. What can I do in the bathroom? Use night lights. Install grab bars by the toilet and in the tub and shower. Do not use towel bars as grab bars. Use non-skid mats or decals in the tub or shower. If you need to sit down in the shower, use a plastic, non-slip stool. Keep the floor dry. Clean up any water that spills on the floor as soon as it happens. Remove soap buildup in the tub or shower regularly. Attach bath mats securely with double-sided non-slip rug tape. Do not have throw rugs and other things on the floor that can make you trip. What can I do in the bedroom? Use night lights. Make sure that you have a light by your bed that is easy to reach. Do not use any sheets or blankets that are too big for your bed. They should not hang down onto the floor. Have a firm chair that has side arms. You can use this for support while you get dressed. Do not have throw rugs and other things on the floor that can make you trip. What can I do in the kitchen? Clean up any spills right away. Avoid walking on wet floors. Keep items that you use a lot in easy-to-reach places. If you need to reach something above you, use a strong  step stool that has a grab bar. Keep electrical cords out of the way. Do not use floor polish or wax that makes floors slippery. If you must use wax, use non-skid floor wax. Do not have throw rugs and other things on the floor that can make you trip. What can I do with my stairs? Do not leave any items on the stairs. Make sure that there are handrails on both sides of the stairs and use them. Fix handrails that are broken or loose. Make sure that handrails are as long as the stairways. Check any carpeting to make sure that it is firmly attached to the stairs. Fix any carpet that is loose or worn. Avoid having throw rugs at the top or bottom of the stairs. If you do have throw rugs, attach them to the floor with carpet  tape. Make sure that you have a light switch at the top of the stairs and the bottom of the stairs. If you do not have them, ask someone to add them for you. What else can I do to help prevent falls? Wear shoes that: Do not have high heels. Have rubber bottoms. Are comfortable and fit you well. Are closed at the toe. Do not wear sandals. If you use a stepladder: Make sure that it is fully opened. Do not climb a closed stepladder. Make sure that both sides of the stepladder are locked into place. Ask someone to hold it for you, if possible. Clearly mark and make sure that you can see: Any grab bars or handrails. First and last steps. Where the edge of each step is. Use tools that help you move around (mobility aids) if they are needed. These include: Canes. Walkers. Scooters. Crutches. Turn on the lights when you go into a dark area. Replace any light bulbs as soon as they burn out. Set up your furniture so you have a clear path. Avoid moving your furniture around. If any of your floors are uneven, fix them. If there are any pets around you, be aware of where they are. Review your medicines with your doctor. Some medicines can make you feel dizzy. This can increase your chance of falling. Ask your doctor what other things that you can do to help prevent falls. This information is not intended to replace advice given to you by your health care provider. Make sure you discuss any questions you have with your health care provider. Document Released: 05/09/2009 Document Revised: 12/19/2015 Document Reviewed: 08/17/2014 Elsevier Interactive Patient Education  2017 [employer].

## 2023-03-11 ENCOUNTER — Encounter (INDEPENDENT_AMBULATORY_CARE_PROVIDER_SITE_OTHER): Payer: Self-pay

## 2023-03-22 ENCOUNTER — Ambulatory Visit (HOSPITAL_COMMUNITY)
Admission: EM | Admit: 2023-03-22 | Discharge: 2023-03-22 | Disposition: A | Discharge status: Home / Self Care | Payer: Medicare PPO | Attending: Family | Admitting: Family

## 2023-03-22 ENCOUNTER — Encounter (HOSPITAL_COMMUNITY): Payer: Self-pay

## 2023-03-22 DIAGNOSIS — M5431 Sciatica, right side: Secondary | ICD-10-CM | POA: Diagnosis not present

## 2023-03-22 DIAGNOSIS — S39012A Strain of muscle, fascia and tendon of lower back, initial encounter: Secondary | ICD-10-CM | POA: Diagnosis not present

## 2023-03-22 MED ORDER — PREDNISONE 10 MG (21) PO TBPK: ORAL_TABLET | Freq: Every day | ORAL | 0 refills | Status: DC

## 2023-03-22 MED ORDER — CYCLOBENZAPRINE HCL 10 MG PO TABS: ORAL_TABLET | ORAL | 0 refills | Status: DC
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
## 2023-03-22 NOTE — ED Triage Notes (Signed)
Patient here today with c/o LB pain since Tuesday. Pain is worsening. Radiates down right leg. She tried taking Aleve with no relief. Patient started when she was lifting some boxes. She also has some right side groin pain.

## 2023-03-22 NOTE — Discharge Instructions (Signed)
Recommend start Prednisone 10mg  tablets- take 6 tablets today then decrease by 1 tablet each day until finished on day 6- take with food. May use Flexeril muscle relaxer- take 1 tablet at night and may take 1/2 to 1 tablet every 8 hours during the day if needed- will cause drowsiness. Continue to apply warm compresses to area as needed. Follow-up in 4 to 5 days if not improving.

## 2023-03-24 ENCOUNTER — Emergency Department (HOSPITAL_COMMUNITY)
Admission: EM | Admit: 2023-03-24 | Discharge: 2023-03-24 | Disposition: A | Discharge status: Home / Self Care | Payer: Medicare PPO | Attending: Emergency Medicine | Admitting: Emergency Medicine

## 2023-03-24 ENCOUNTER — Emergency Department (HOSPITAL_COMMUNITY): Payer: Medicare PPO

## 2023-03-24 ENCOUNTER — Encounter (HOSPITAL_COMMUNITY): Payer: Self-pay

## 2023-03-24 ENCOUNTER — Other Ambulatory Visit: Payer: Self-pay

## 2023-03-24 ENCOUNTER — Telehealth: Payer: Self-pay

## 2023-03-24 DIAGNOSIS — E119 Type 2 diabetes mellitus without complications: Secondary | ICD-10-CM

## 2023-03-24 DIAGNOSIS — Z7982 Long term (current) use of aspirin: Secondary | ICD-10-CM | POA: Diagnosis not present

## 2023-03-24 DIAGNOSIS — M549 Dorsalgia, unspecified: Secondary | ICD-10-CM | POA: Diagnosis present

## 2023-03-24 DIAGNOSIS — M25561 Pain in right knee: Secondary | ICD-10-CM | POA: Insufficient documentation

## 2023-03-24 DIAGNOSIS — W19XXXA Unspecified fall, initial encounter: Secondary | ICD-10-CM | POA: Diagnosis not present

## 2023-03-24 DIAGNOSIS — M5441 Lumbago with sciatica, right side: Secondary | ICD-10-CM | POA: Insufficient documentation

## 2023-03-24 MED ORDER — WALKER MISC: 0 refills | Status: DC

## 2023-03-24 NOTE — ED Provider Notes (Signed)
[place] EMERGENCY DEPARTMENT AT [facility] Provider Note   CSN: [id number] Arrival date & time: 03/24/23  7829     History Chief Complaint  Patient presents with   Fall        Knee Pain         Margaret Stark is a 67 y.o. female with history of chronic back pain and arthritis presents emerged from today for evaluation after ground-level fall.  Patient reports that she was putting her weight on her right leg to reach her left leg under the desk to pull out a piece of paper when she felt her right leg could not hold her and she fell landing straight on her bottom.  She reports that she been dealing with chronic back pain for a while now and was recently seen by urgent care and put on prednisone.  She for feels like is helping her some but has been walking with a cane because she feels like she is unsteady on her feet because of the back pain/weakness she is having.  She is not having any fevers, saddle anesthesia, fecal or urinary incontinence.  She does not have any trouble with urination or any pain or dysuria with urinating.  She denies any fevers or any IV drug use ever.  Currently she is complaining about more right groin pain which she reports that her right back pain is now moved into the right groin after the past few days.  Pain in the right knee is diffuse in the anterior and posterior aspect but does not radiate.  She is been ambulatory here.  She denies any numbness or tingling.  No known drug allergies.  She reports she did not hit her head.  No blood thinner use.   Fall Pertinent negatives include no chest pain and no shortness of breath.  Knee Pain Associated symptoms: back pain   Associated symptoms: no fever        Home Medications Prior to Admission medications   Medication Sig Start Date End Date Taking? Authorizing Provider  Misc. Devices [facility]) MISC 1 rolling walker for home use 03/24/23  Yes Garlon Hatchet, PA-C  aspirin EC 81 MG tablet Take  81 mg by mouth daily. Swallow whole.    [provider]  atorvastatin (LIPITOR) 40 MG tablet Take 1 tablet (40 mg total) by mouth daily. 02/01/23   Glori Luis, MD  BD PEN NEEDLE MICRO U/F 32G X 6 MM MISC USE ONCE DAILY WITH SAXENDA Patient not taking: Reported on 03/10/2023 04/23/20   Glori Luis, MD  cyclobenzaprine (FLEXERIL) 10 MG tablet Take 1/2 to 1 whole tablet by mouth every 8 hours as needed for muscle spasms/pain. 03/22/23   Sudie Grumbling, NP  diclofenac Sodium (VOLTAREN) 1 % GEL Apply 2 g topically daily as needed (knee pain).    [provider]  Latanoprost 0.005 % EMUL latanoprost 0.005 % eye drops    [provider]  metFORMIN (GLUCOPHAGE) 500 MG tablet Take 1 tablet (500 mg total) by mouth 2 (two) times daily with a meal. 01/06/23   Worthy Rancher B, FNP  predniSONE (STERAPRED UNI-PAK 21 TAB) 10 MG (21) TBPK tablet Take by mouth daily. Take 6 tabs by mouth first day then decrease by 1 tablet each day until finished on day 6. 03/22/23   Sudie Grumbling, NP  Vitamin D, Ergocalciferol, (DRISDOL) 1.25 MG (50000 UT) CAPS capsule ergocalciferol (vitamin D2) 1,250 mcg (50,000 unit) capsule  [provider]      Allergies    Patient has no known allergies.    Review of Systems   Review of Systems  Constitutional:  Negative for chills and fever.  Respiratory:  Negative for shortness of breath.   Cardiovascular:  Negative for chest pain.  Gastrointestinal:  Negative for abdominal distention, constipation, diarrhea, nausea and vomiting.       Denies any fecal incontinence  Genitourinary:  Negative for dysuria and hematuria.       Denies any urinary incontinence or urinary retention.  Musculoskeletal:  Positive for back pain and myalgias (Right knee pain).       Denies any saddle anesthesia  Neurological:  Negative for weakness and numbness.    Physical Exam Updated Vital Signs BP (!) 179/94 (BP Location: Right Arm)   Pulse 98   Temp  98.1 F (36.7 C) (Oral)   Resp 18   Ht 5\' 4"  (1.626 m)   Wt 114.3 kg   SpO2 100%   BMI 43.26 kg/m  Physical Exam Vitals and nursing note reviewed.  Constitutional:      General: She is not in acute distress.    Appearance: Normal appearance. She is not ill-appearing or toxic-appearing.  Eyes:     General: No scleral icterus. Pulmonary:     Effort: Pulmonary effort is normal. No respiratory distress.  Musculoskeletal:     Right lower leg: No edema.     Left lower leg: No edema.     Comments: Right lower lumbar paraspinal tenderness palpation.  No midline tenderness palpation.  Patient has pain with lifting her right leg into the back.  She is neurovascular intact distally.  For her knee, I do not appreciate any swelling.  Knees appear symmetric in both coloration and size.  I do not appreciate any temperature changes on palpation.  Her compartments are soft.  She is palpable DP and PT pulses bilaterally.  She has diffuse tenderness to the anterior and posterior aspect.  I do not appreciate any step-offs or deformities or any signs of trauma.  The patient is ambulatory with her cane with slightly analgesic gait.  She does not have any tenderness into the lower leg, foot, ankle, or upper thigh.    Skin:    General: Skin is dry.  Neurological:     General: No focal deficit present.     Mental Status: She is alert. Mental status is at baseline.  Psychiatric:        Mood and Affect: Mood normal.     ED Results / Procedures / Treatments   Labs (all labs ordered are listed, but only abnormal results are displayed) Labs Reviewed - No data to display  EKG None  Radiology DG Hips Bilat W or Wo Pelvis 5 Views  Result Date: 03/24/2023 CLINICAL DATA:  Recent fall with buttock pain, initial encounter EXAM: DG HIP (WITH OR WITHOUT PELVIS) 5+V BILAT COMPARISON:  None Available. FINDINGS: Pelvic ring is intact. Degenerative changes of lumbar spine are seen. Degenerative changes of hip joints  are noted. No acute fracture or dislocation is seen. No soft tissue abnormality is noted. IMPRESSION: No acute abnormality noted. Electronically Signed   By: Alcide Clever M.D.   On: 03/24/2023 20:33   DG Knee Complete 4 Views Right  Result Date: 03/24/2023 CLINICAL DATA:  Pain EXAM: RIGHT KNEE - COMPLETE 4+ VIEW COMPARISON:  None Available. FINDINGS: No evidence of fracture, dislocation, or joint effusion. There is mild medial compartment  joint space narrowing. There is tricompartmental osteophyte formation. Soft tissues are unremarkable. IMPRESSION: 1. No acute bony abnormality. 2. Tricompartmental degenerative changes, most pronounced in the medial compartment. Electronically Signed   By: Darliss Cheney M.D.   On: 03/24/2023 19:35    Procedures Procedures    Medications Ordered in ED Medications - No data to display  ED Course/ Medical Decision Making/ A&P Clinical Course as of 03/25/23 2319  Wed Mar 24, 2023  2036 7 YOF with leg pain and GLF today. Fall secondary to weakness she reports.   [CC]    Clinical Course User Index [CC] Glyn Ade, MD   Medical Decision Making Amount and/or Complexity of Data Reviewed Radiology: ordered.   67 y.o. female presents to the ER today for evaluation of right knee pain and right hip pain sp fall. Differential diagnosis includes but is not limited to trauma. Vital signs show elevated BP, otherwise unremarkable. Physical exam as noted above.   XR imaging of the right knee shows  1. No acute bony abnormality. 2. Tricompartmental degenerative changes, most pronounced in the medial compartment.  Per radiology read.  X-ray of the pelvis shows no acute abnormality noted.  Per radiology read.  For the patient's weakness/pain she is having in the right lower leg, from previous MRI she had obtained in 2018, it showed advanced L5 and S1 disc degeneration including a right-sided protrusion resulting in severe right lateral recess and moderate  bilateral neural foraminal stenosis and also some severe L4-L5 facet arthrosis with slight anterolisthesis.  This is consistent with the patient's pain she has from her groin radiating down to the lateral aspect of her right thigh.  I do not think she needs an emergent MRI at this time but can follow-up outpatient.  I discussed with case my attending and he agrees with outpatient MRI and neurosurgery follow-up.  Will also give her orthopedic follow-up given the arthritis seen in her knee.  I did provide her a knee splint however we did not have one large enough so she was given a knee immobilizer.  She is also given a wrap.  We discussed the RICE method as well.  I ordered her walker to provide her more stability over a cane.  We discussed the need for a walker over a cane.  Printed prescription given.  She does not have any overt weakness in the right side but is has pain with lifting her leg.  She is neuro vascularly intact distally.  Compartments are soft.  She does not have any back red flag symptoms such as urinary fecal incontinence, urinary retention, fever, IV drug use, or saddle anesthesia.  This pain is been going on for quite a while now.  I will lower suspicion for any cauda equina or any epidural abscess.  Likely acute on chronic back pain which he is already been treated with prednisone and she can follow-up with outpatient.  Stable for outpatient follow-up.  We discussed plan at bedside.  Orthopedic and neurosurgery follow-up given.  We discussed strict return precautions and red flag symptoms. The patient verbalized their understanding and agrees to the plan. The patient is stable and being discharged home in good condition.  Portions of this report may have been transcribed using voice recognition software. Every effort was made to ensure accuracy; however, inadvertent computerized transcription errors may be present.   I discussed this case with my attending physician who cosigned this note  including patient's presenting symptoms, physical exam, and planned diagnostics  and interventions. Attending physician stated agreement with plan or made changes to plan which were implemented.   Final Clinical Impression(s) / ED Diagnoses Final diagnoses:  Fall, initial encounter  Acute pain of right knee  Acute right-sided low back pain with right-sided sciatica    Rx / DC Orders ED Discharge Orders          Ordered    Misc. Devices [facility]) MISC        03/24/23 2229              Achille Rich, PA-C 03/25/23 4098    Glyn Ade, MD 03/29/23 1504

## 2023-03-24 NOTE — ED Triage Notes (Addendum)
Pt coming in today following a fall, pt states she fell on her butt, and heard her right knee pop. Pt endorses pain in both areas, denies hitting head or blood thinner use. Pt able to bear weight and walk at this time.

## 2023-03-24 NOTE — Telephone Encounter (Signed)
Margaret Stark called from [facility] to state she needs [name] to edit referral to state "diabetes" instead of "prediabetes" as patient's A1c is 6.9  E11.9 - ICD 10 for Type 2 Diabetes

## 2023-03-24 NOTE — Discharge Instructions (Addendum)
You were seen in the ER today for evaluation of your knee pain after a fall. Your XR shows significant arthritis. I have given you follow up with [facility] for your back pain. This is likely your stenosis seen on your previous MRI. For your knee, I have given you follow up with an orthopedic provider for further evaluation. Please make sure you call both of these specialities for follow up. I have included additional information on this to the discharge paperwork. Please review. For pain, I recommend 1000mg  of Tylenol every 6 hours to take while you are on the prednisone. When you are finished with the predisone, you can take ibuprofen 600mg  every 6 hours as needed for pain.  I recommend ambulating with a walker more than a cane as this provides you more support and is more steady and less likely for falls.  I have given you a prescription for this.  Please give this to your local medical supply company.  If you have any concerns, new or worsening symptoms, please return to the nearest emergency department for evaluation.  Contact a doctor if: The knee pain does not stop. The knee pain changes or gets worse. You have a fever along with knee pain. Your knee is red or feels warm when you touch it. Your knee gives out or locks up. Get help right away if: Your knee swells, and the swelling gets worse. You cannot move your knee. You have very bad knee pain that does not get better with pain medicine.

## 2023-03-25 ENCOUNTER — Telehealth: Payer: Self-pay

## 2023-03-25 NOTE — Transitions of Care (Post Inpatient/ED Visit) (Signed)
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 pt said did not trip or fall over anything but leg just did not support her and she fell in classroom; pt has numbness rt leg also. 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 Pt said she may call [employer] office and let them know what happened. On 03/22/23 pt was seen at [facility] on [address] for back pain and rt knee pain also. Pt was given prednisone and flexeril which pt is still taking. Today rt knee is still painful and swollen,pt has not gotten walker yet but pt does have walker rx. Pt already has ortho appt on 03/31/23 at 8:30 with [facility] and pt said they will ck her back and knee pain. After ortho appt pt will decide if needs to see [facility] or not. Pt has [facility] contact info if pt needs to make appt with PCP also. Sending note to Dr Marikay Alar.     03/25/2023  Name: SAKEENAH DOORLEY MRN: [id number] DOB: [birth year]  Today's TOC FU Call Status: Today's TOC FU Call Status:: Successful TOC FU Call Completed TOC FU Call Complete Date: 03/25/23 Patient's Name and Date of Birth confirmed.  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  pt said did not trip or fall over anyting but leg just did not support her and she fell; pt has numbness rtm leg) Neurologic Diagnosis:  (back pain) Orthopedic/Injury Diagnosis:  (rt knee pain) How have you been since  you were released from the hospital?: Better Any questions or concerns?: No  Items Reviewed: Did you receive and understand the discharge instructions provided?: Yes Medications obtained,verified, and reconciled?: Yes (Medications Reviewed) Any new allergies since your discharge?: No Dietary orders reviewed?: NA Do you have support at home?: Yes People in Home: spouse Name of Support/Comfort Primary Source: Johnnie  Medications Reviewed Today: Medications Reviewed Today   Medications were not reviewed in this encounter     Home Care and Equipment/Supplies: Were Home Health Services Ordered?: No Any new equipment or medical supplies ordered?: Yes (pt was given rx for walker at ED visit; pt has not got walker yet.) Were you able to get the equipment/medical supplies?: No Do you have any questions related to the use of the equipment/supplies?: No  Functional Questionnaire: Do you need assistance with bathing/showering or dressing?: No Do you need assistance with meal preparation?: No Do you need assistance with eating?: No Do you have difficulty maintaining continence: No Do you need assistance with getting out of bed/getting out of a chair/moving?: No Do you have difficulty managing or taking your medications?: No  Follow up appointments reviewed: PCP Follow-up appointment confirmed?: NA Specialist Hospital Follow-up appointment confirmed?: Yes Date of Specialist follow-up appointment?: 03/31/23 Follow-Up Specialty Provider:: [facility] Do you need transportation to your follow-up appointment?: No Do you understand care options if your condition(s) worsen?: Yes-patient verbalized understanding    SIGNATURE  Lewanda Rife, LPN

## 2023-03-25 NOTE — Telephone Encounter (Signed)
Referral changed

## 2023-03-26 NOTE — Telephone Encounter (Signed)
Noted  

## 2023-03-30 ENCOUNTER — Ambulatory Visit (INDEPENDENT_AMBULATORY_CARE_PROVIDER_SITE_OTHER): Payer: Medicare PPO

## 2023-03-30 DIAGNOSIS — E538 Deficiency of other specified B group vitamins: Secondary | ICD-10-CM

## 2023-03-30 MED ORDER — CYANOCOBALAMIN 1000 MCG/ML IJ SOLN
1000.0000 ug | Freq: Once | INTRAMUSCULAR | Status: AC
Start: 2023-03-30 — End: 2023-03-30
  Administered 2023-03-30: 1000 ug via INTRAMUSCULAR

## 2023-03-30 NOTE — Progress Notes (Signed)
Patient presented for B 12 injection to left deltoid, patient voiced no concerns nor showed any signs of distress during injection. 

## 2023-03-31 DIAGNOSIS — M170 Bilateral primary osteoarthritis of knee: Secondary | ICD-10-CM | POA: Diagnosis not present

## 2023-03-31 DIAGNOSIS — M545 Low back pain, unspecified: Secondary | ICD-10-CM | POA: Diagnosis not present

## 2023-03-31 DIAGNOSIS — M25551 Pain in right hip: Secondary | ICD-10-CM | POA: Diagnosis not present

## 2023-04-01 DIAGNOSIS — M5116 Intervertebral disc disorders with radiculopathy, lumbar region: Secondary | ICD-10-CM | POA: Diagnosis not present

## 2023-04-05 ENCOUNTER — Ambulatory Visit (INDEPENDENT_AMBULATORY_CARE_PROVIDER_SITE_OTHER): Payer: Medicare PPO | Admitting: Family Medicine

## 2023-04-05 ENCOUNTER — Encounter: Payer: Self-pay | Admitting: Family Medicine

## 2023-04-05 ENCOUNTER — Other Ambulatory Visit: Payer: Self-pay | Admitting: Family

## 2023-04-05 VITALS — BP 134/82 | HR 93 | Temp 98.7°F | Ht 64.0 in | Wt 247.4 lb

## 2023-04-05 DIAGNOSIS — Z23 Encounter for immunization: Secondary | ICD-10-CM | POA: Diagnosis not present

## 2023-04-05 DIAGNOSIS — M5430 Sciatica, unspecified side: Secondary | ICD-10-CM | POA: Insufficient documentation

## 2023-04-05 DIAGNOSIS — E785 Hyperlipidemia, unspecified: Secondary | ICD-10-CM | POA: Diagnosis not present

## 2023-04-05 DIAGNOSIS — E119 Type 2 diabetes mellitus without complications: Secondary | ICD-10-CM | POA: Diagnosis not present

## 2023-04-05 DIAGNOSIS — Z7985 Long-term (current) use of injectable non-insulin antidiabetic drugs: Secondary | ICD-10-CM | POA: Diagnosis not present

## 2023-04-05 DIAGNOSIS — R030 Elevated blood-pressure reading, without diagnosis of hypertension: Secondary | ICD-10-CM

## 2023-04-05 LAB — POCT GLYCOSYLATED HEMOGLOBIN (HGB A1C): Hemoglobin A1C: 6.4 % — AB (ref 4.0–5.6)

## 2023-04-05 LAB — MICROALBUMIN / CREATININE URINE RATIO
Creatinine,U: 153 mg/dL
Microalb Creat Ratio: 6.1 mg/g (ref 0.0–30.0)
Microalb, Ur: 9.3 mg/dL — ABNORMAL HIGH (ref 0.0–1.9)

## 2023-04-05 MED ORDER — OZEMPIC (0.25 OR 0.5 MG/DOSE) 2 MG/3ML ~~LOC~~ SOPN: PEN_INJECTOR | SUBCUTANEOUS | 2 refills | Status: DC

## 2023-04-05 NOTE — Assessment & Plan Note (Signed)
Chronic issue.  Weight has trended down some.  She should continue to walk 3 times a week.  She should continue to eat a healthy diet.  Given her diabetic diagnosis we are going to start her on Ozempic.

## 2023-04-05 NOTE — Patient Instructions (Signed)
Nice to see you. Please make sure you see an eye doctor once yearly given your diabetic diagnosis. Please let me know if the Ozempic is too expensive. If you develop any side effects with the Ozempic please let us know.

## 2023-04-05 NOTE — Assessment & Plan Note (Signed)
Patient reports diagnosis of sciatica recently.  Has been seeing orthopedics and doing physical therapy.  She will continue to follow with them.

## 2023-04-05 NOTE — Addendum Note (Signed)
Addended by: Prince Solian A on: 04/05/2023 09:55 AM   Modules accepted: Orders

## 2023-04-05 NOTE — Assessment & Plan Note (Signed)
Chronic issue.  Continue Lipitor 40 mg daily. 

## 2023-04-05 NOTE — Assessment & Plan Note (Signed)
Chronic issue.  Check A1c.  Check urine microalbumin creatinine ratio.  Starting Ozempic given patient cannot tolerate metformin.  We will start the patient on ozempic.  They were counseled on the risk of pancreatitis and gallbladder disease.  Discussed the risk of nausea.  She was advised to get evaluated immediately if they develop abdominal pain.  If they develop excessive nausea they will contact us right away.  I discussed that medullary thyroid cancer has been seen in rat studies.  The patient confirmed no personal or family history of thyroid cancer, parathyroid cancer, or adrenal gland cancer.  Discussed that we thus far have not seen medullary thyroid cancer result from use of this type of medication in humans.  They were advised to monitor the thyroid area and contact us for any lumps, swelling, trouble swallowing, or any other changes in this area.  Discussed goal weight loss of 1 to 2 pounds a week while on this medication.

## 2023-04-05 NOTE — Assessment & Plan Note (Addendum)
Improved on recheck though not quite to goal.  Encouraged continued diet and exercise.  Discussed if BP is not at goal of less than 130/80 during her next visit we would consider adding BP medication.

## 2023-04-05 NOTE — Progress Notes (Signed)
Margaret Alar, MD Phone: [phone]  Margaret Stark is a 67 y.o. female who presents today for follow-up.  Obesity: Patient has started walking 1 mile 3 times a week.  She notes her diet is hit or miss this she reports she is doing better.  Hyperlipidemia: Taking Lipitor.  No chest pain, claudication, right upper quadrant pain, or myalgias.  Diabetes: Not checking sugars.  She had side effects with metformin and notes she felt bad and constipated on this.  No polyuria or polydipsia.  No hypoglycemia.  Fall: Patient notes being evaluated through orthopedics recently after a fall that sent her to the urgent care.  She notes her leg just gave out on her.  She was diagnosed with sciatica and took a course of prednisone.  She notes orthopedics has her doing physical therapy and she feels better from this.  She also reports she was diagnosed with bone-on-bone arthritis in her knees.  Social History   Tobacco Use  Smoking Status Never  Smokeless Tobacco Never    Current Outpatient Medications on File Prior to Visit  Medication Sig Dispense Refill   aspirin EC 81 MG tablet Take 81 mg by mouth daily. Swallow whole.     atorvastatin (LIPITOR) 40 MG tablet Take 1 tablet (40 mg total) by mouth daily. 90 tablet 3   BD PEN NEEDLE MICRO U/F 32G X 6 MM MISC USE ONCE DAILY WITH SAXENDA 100 each 1   cyclobenzaprine (FLEXERIL) 10 MG tablet Take 1/2 to 1 whole tablet by mouth every 8 hours as needed for muscle spasms/pain. 15 tablet 0   diclofenac Sodium (VOLTAREN) 1 % GEL Apply 2 g topically daily as needed (knee pain).     Latanoprost 0.005 % EMUL latanoprost 0.005 % eye drops     metFORMIN (GLUCOPHAGE) 500 MG tablet Take 1 tablet (500 mg total) by mouth 2 (two) times daily with a meal. 60 tablet 3   Misc. Devices (WALKER) MISC 1 rolling walker for home use 1 each 0   Vitamin D, Ergocalciferol, (DRISDOL) 1.25 MG (50000 UT) CAPS capsule ergocalciferol (vitamin D2) 1,250 mcg (50,000 unit) capsule      No current facility-administered medications on file prior to visit.     ROS see history of present illness  Objective  Physical Exam Vitals:   04/05/23 0921 04/05/23 0922  BP: (!) 140/84 134/82  Pulse: 93   Temp: 98.7 F (37.1 C)   SpO2: 99%     BP Readings from Last 3 Encounters:  04/05/23 134/82  03/24/23 (!) 179/94  03/22/23 138/81   Wt Readings from Last 3 Encounters:  04/05/23 247 lb 6.4 oz (112.2 kg)  03/24/23 252 lb (114.3 kg)  03/22/23 255 lb (115.7 kg)    Physical Exam Constitutional:      General: She is not in acute distress.    Appearance: She is not diaphoretic.  Cardiovascular:     Rate and Rhythm: Normal rate and regular rhythm.     Heart sounds: Normal heart sounds.  Pulmonary:     Effort: Pulmonary effort is normal.     Breath sounds: Normal breath sounds.  Skin:    General: Skin is warm and dry.  Neurological:     Mental Status: She is alert.      Assessment/Plan: Please see individual problem list.  Hyperlipidemia, unspecified hyperlipidemia type Assessment & Plan: Chronic issue.  Continue Lipitor 40 mg daily.   Morbid obesity (HCC) Assessment & Plan: Chronic issue.  Weight has trended  down some.  She should continue to walk 3 times a week.  She should continue to eat a healthy diet.  Given her diabetic diagnosis we are going to start her on Ozempic.   Type 2 diabetes mellitus without complication, without long-term current use of insulin (HCC) Assessment & Plan: Chronic issue.  Check A1c.  Check urine microalbumin creatinine ratio.  Starting Ozempic given patient cannot tolerate metformin.  We will start the patient on ozempic.  They were counseled on the risk of pancreatitis and gallbladder disease.  Discussed the risk of nausea.  She was advised to get evaluated immediately if they develop abdominal pain.  If they develop excessive nausea they will contact us right away.  I discussed that medullary thyroid cancer has been seen  in rat studies.  The patient confirmed no personal or family history of thyroid cancer, parathyroid cancer, or adrenal gland cancer.  Discussed that we thus far have not seen medullary thyroid cancer result from use of this type of medication in humans.  They were advised to monitor the thyroid area and contact us for any lumps, swelling, trouble swallowing, or any other changes in this area.  Discussed goal weight loss of 1 to 2 pounds a week while on this medication.   Orders: -     Ozempic (0.25 or 0.5 MG/DOSE); Inject 0.25 mg into the skin once a week for 28 days, THEN 0.5 mg once a week.  Dispense: 3 mL; Refill: 2 -     Hemoglobin A1c -     Microalbumin / creatinine urine ratio -     POCT glycosylated hemoglobin (Hb A1C)  Sciatica, unspecified laterality Assessment & Plan: Patient reports diagnosis of sciatica recently.  Has been seeing orthopedics and doing physical therapy.  She will continue to follow with them.   Elevated blood pressure reading Assessment & Plan: Improved on recheck though not quite to goal.  Encouraged continued diet and exercise.  Discussed if BP is not at goal of less than 130/80 during her next visit we would consider adding BP medication.      Health Maintenance: Prevnar 20 given today.  Patient was encouraged to see ophthalmology yearly.  Return in about 3 months (around 07/05/2023) for Diabetes/weight.   Margaret Alar, MD [facility]

## 2023-04-08 DIAGNOSIS — M5116 Intervertebral disc disorders with radiculopathy, lumbar region: Secondary | ICD-10-CM | POA: Diagnosis not present

## 2023-04-14 DIAGNOSIS — M5116 Intervertebral disc disorders with radiculopathy, lumbar region: Secondary | ICD-10-CM | POA: Diagnosis not present

## 2023-04-22 DIAGNOSIS — M5116 Intervertebral disc disorders with radiculopathy, lumbar region: Secondary | ICD-10-CM | POA: Diagnosis not present

## 2023-04-27 ENCOUNTER — Ambulatory Visit: Payer: Medicare PPO

## 2023-04-27 ENCOUNTER — Ambulatory Visit (INDEPENDENT_AMBULATORY_CARE_PROVIDER_SITE_OTHER): Payer: Medicare PPO

## 2023-04-27 DIAGNOSIS — E538 Deficiency of other specified B group vitamins: Secondary | ICD-10-CM | POA: Diagnosis not present

## 2023-04-27 MED ORDER — CYANOCOBALAMIN 1000 MCG/ML IJ SOLN
1000.0000 ug | Freq: Once | INTRAMUSCULAR | Status: AC
Start: 2023-04-27 — End: 2023-04-27
  Administered 2023-04-27: 1000 ug via INTRAMUSCULAR

## 2023-04-27 NOTE — Progress Notes (Signed)
Patient presented for B 12 injection to left deltoid, patient voiced no concerns nor showed any signs of distress during injection. 

## 2023-05-03 DIAGNOSIS — H524 Presbyopia: Secondary | ICD-10-CM | POA: Diagnosis not present

## 2023-05-03 DIAGNOSIS — H5203 Hypermetropia, bilateral: Secondary | ICD-10-CM | POA: Diagnosis not present

## 2023-05-03 DIAGNOSIS — H35033 Hypertensive retinopathy, bilateral: Secondary | ICD-10-CM | POA: Diagnosis not present

## 2023-05-03 DIAGNOSIS — H40053 Ocular hypertension, bilateral: Secondary | ICD-10-CM | POA: Diagnosis not present

## 2023-05-19 ENCOUNTER — Encounter: Payer: Self-pay | Admitting: Registered"

## 2023-05-19 ENCOUNTER — Encounter: Payer: Medicare PPO | Attending: Family Medicine | Admitting: Registered"

## 2023-05-19 DIAGNOSIS — M5116 Intervertebral disc disorders with radiculopathy, lumbar region: Secondary | ICD-10-CM | POA: Diagnosis not present

## 2023-05-19 DIAGNOSIS — E119 Type 2 diabetes mellitus without complications: Secondary | ICD-10-CM | POA: Insufficient documentation

## 2023-05-19 NOTE — Patient Instructions (Signed)
-   Aim to have 1/2 plate of non-starchy vegetables with lunch and dinner.   - Aim to have a source of carbohydrates and protein.   - Continue having majority water to drink daily.

## 2023-05-19 NOTE — Progress Notes (Signed)
Diabetes Self-Management Education  Visit Type: First/Initial  Appt. Start Time: 8:08  Appt. End Time: 9:18  05/19/2023  [name], identified by name and date of birth, is a 67 y.o. female with a diagnosis of Diabetes: Type 2.   ASSESSMENT  States she knows what to do and its just a matter of her doing it.  Doesn't like cooking and its easier for her to stop and get a burger when on the way home. States she needs to be motivated to eat better. States it will be better if she can find recipes that will help her not have to cook daily. States she also likes junk food and knows she needs to stop drinking sodas. States she has cut back substantially on drinking sodas daily. States she used to eat honey bun and drink soda daily when working. States she feels that meal planning for the week will help her make better choices.   Pt expectations: wants easy to cook, healthy meals and the best foods to eat to maintain diabetes better  There were no vitals taken for this visit. There is no height or weight on file to calculate BMI.   Diabetes Self-Management Education - 05/19/23 0816       Visit Information   Visit Type First/Initial      Initial Visit   Diabetes Type Type 2    Date Diagnosed 12/2022    Are you currently following a meal plan? No    Are you taking your medications as prescribed? Yes      Health Coping   How would you rate your overall health? Good      Psychosocial Assessment   Patient Belief/Attitude about Diabetes Motivated to manage diabetes    What is the hardest part about your diabetes right now, causing you the most concern, or is the most worrisome to you about your diabetes?   Making healty food and beverage choices    Self-care barriers None    Self-management support Family;Doctor's office    Other persons present Patient    Patient Concerns Nutrition/Meal planning    Special Needs None    Preferred Learning Style No preference indicated     Learning Readiness Change in progress    How often do you need to have someone help you when you read instructions, pamphlets, or other written materials from your doctor or pharmacy? 1 - Never    What is the last grade level you completed in school? master's degree      Complications   Last HgB A1C per patient/outside source 6.4 %    How often do you check your blood sugar? 0 times/day (not testing)    Number of hypoglycemic episodes per month 0    Number of hyperglycemic episodes ( >200mg /dL): Never    Can you tell when your blood sugar is high? No    Have you had a dilated eye exam in the past 12 months? Yes    Have you had a dental exam in the past 12 months? Yes    Are you checking your feet? No      Dietary Intake   Breakfast 8 am - [place]-ham and cheese biscuit (on skinny biscuit) + hash brown + 1/2 and 1/2 tea    Lunch 1:20 pm - 6 pack PB crackers + orange soda (16 oz)    Dinner 5:30 pm - eggs + sausage    Snack (evening) 8:30 pm - small bowl of ice cream  Beverage(s) water (64 oz), soda (16 oz), 1/2 and 1/2 tea (16 oz); 64+ oz      Activity / Exercise   Activity / Exercise Type Light (walking / raking leaves)    How many days per week do you exercise? 2    How many minutes per day do you exercise? 25    Total minutes per week of exercise 50      Patient Education   Previous Diabetes Education No    Disease Pathophysiology Definition of diabetes, type 1 and 2, and the diagnosis of diabetes    Healthy Eating Plate Method;Effects of alcohol on blood glucose and safety factors with consumption of alcohol.;Role of diet in the treatment of diabetes and the relationship between the three main macronutrients and blood glucose level    Monitoring Interpreting lab values - A1C, lipid, urine microalbumina.;Identified appropriate SMBG and/or A1C goals.    Acute complications Taught prevention, symptoms, and  treatment of hypoglycemia - the 15 rule.;Discussed and identified  patients' prevention, symptoms, and treatment of hyperglycemia.    Diabetes Stress and Support Identified and addressed patients feelings and concerns about diabetes;Role of stress on diabetes    Lifestyle and Health Coping Lifestyle issues that need to be addressed for better diabetes care      Individualized Goals (developed by patient)   Nutrition General guidelines for healthy choices and portions discussed    Physical Activity Exercise 1-2 times per week;30 minutes per day    Medications take my medication as prescribed    Monitoring  Not Applicable    Reducing Risk treat hypoglycemia with 15 grams of carbs if blood glucose less than 70mg /dL      Post-Education Assessment   Patient understands the diabetes disease and treatment process. Demonstrates understanding / competency    Patient understands incorporating nutritional management into lifestyle. Demonstrates understanding / competency    Patient undertands incorporating physical activity into lifestyle. Needs Review    Patient understands using medications safely. Needs Review    Patient understands monitoring blood glucose, interpreting and using results Comprehends key points    Patient understands prevention, detection, and treatment of acute complications. Comprehends key points    Patient understands prevention, detection, and treatment of chronic complications. Needs Review    Patient understands how to develop strategies to address psychosocial issues. Demonstrates understanding / competency    Patient understands how to develop strategies to promote health/change behavior. Needs Review      Outcomes   Expected Outcomes Demonstrated interest in learning. Expect positive outcomes    Future DMSE 3-4 months    Program Status Not Completed             Individualized Plan for Diabetes Self-Management Training:   Learning Objective:  Patient will have a greater understanding of diabetes self-management. Patient  education plan is to attend individual and/or group sessions per assessed needs and concerns.   Plan:   Patient Instructions  - Aim to have 1/2 plate of non-starchy vegetables with lunch and dinner.   - Aim to have a source of carbohydrates and protein.   - Continue having majority water to drink daily.   Expected Outcomes:  Demonstrated interest in learning. Expect positive outcomes  Education material provided: ADA - How to Thrive: A Guide for Your Journey with Diabetes  If problems or questions, patient to contact team via:  Phone and Email  Future DSME appointment: 3-4 months

## 2023-05-25 ENCOUNTER — Ambulatory Visit (INDEPENDENT_AMBULATORY_CARE_PROVIDER_SITE_OTHER): Payer: Medicare PPO

## 2023-05-25 DIAGNOSIS — E538 Deficiency of other specified B group vitamins: Secondary | ICD-10-CM | POA: Diagnosis not present

## 2023-05-25 MED ORDER — CYANOCOBALAMIN 1000 MCG/ML IJ SOLN
1000.0000 ug | Freq: Once | INTRAMUSCULAR | Status: AC
Start: 2023-05-25 — End: 2023-05-25
  Administered 2023-05-25: 1000 ug via INTRAMUSCULAR

## 2023-05-25 NOTE — Progress Notes (Signed)
Patient presented for B 12 injection to left deltoid, patient voiced no concerns nor showed any signs of distress during injection. 

## 2023-06-28 ENCOUNTER — Ambulatory Visit (INDEPENDENT_AMBULATORY_CARE_PROVIDER_SITE_OTHER): Payer: Medicare PPO

## 2023-06-28 ENCOUNTER — Other Ambulatory Visit: Payer: Self-pay | Admitting: Family Medicine

## 2023-06-28 DIAGNOSIS — E538 Deficiency of other specified B group vitamins: Secondary | ICD-10-CM | POA: Diagnosis not present

## 2023-06-28 DIAGNOSIS — E119 Type 2 diabetes mellitus without complications: Secondary | ICD-10-CM

## 2023-06-28 MED ORDER — CYANOCOBALAMIN 1000 MCG/ML IJ SOLN
1000.0000 ug | Freq: Once | INTRAMUSCULAR | Status: AC
Start: 2023-06-28 — End: 2023-06-28
  Administered 2023-06-28: 1000 ug via INTRAMUSCULAR

## 2023-06-28 NOTE — Progress Notes (Signed)
Pt presented for their vitamin B12 injection. Pt was identified through two identifiers. Pt tolerated shot well in their right deltoid.  

## 2023-07-09 ENCOUNTER — Ambulatory Visit: Payer: Medicare PPO | Admitting: Family Medicine

## 2023-07-09 ENCOUNTER — Encounter: Payer: Self-pay | Admitting: Family Medicine

## 2023-07-09 VITALS — BP 118/80 | HR 89 | Temp 98.4°F | Ht 64.0 in | Wt 238.2 lb

## 2023-07-09 DIAGNOSIS — Z7985 Long-term (current) use of injectable non-insulin antidiabetic drugs: Secondary | ICD-10-CM

## 2023-07-09 DIAGNOSIS — R200 Anesthesia of skin: Secondary | ICD-10-CM | POA: Diagnosis not present

## 2023-07-09 DIAGNOSIS — E119 Type 2 diabetes mellitus without complications: Secondary | ICD-10-CM

## 2023-07-09 LAB — POCT GLYCOSYLATED HEMOGLOBIN (HGB A1C): Hemoglobin A1C: 5.8 % — AB (ref 4.0–5.6)

## 2023-07-09 NOTE — Assessment & Plan Note (Signed)
Chronic issue.  Check A1c.  Continue Ozempic 0.5 mg weekly.  Discussed the potential for increasing the dose to 1 mg weekly to help with weight loss though the patient notes she would like to stick on her current dosing.

## 2023-07-09 NOTE — Assessment & Plan Note (Signed)
Chronic issue.  Congratulated on weight loss.  She will continue Ozempic 0.5 mg weekly.  Continue to work on diet and exercise.  Discussed increasing her exercise as she is able to.

## 2023-07-09 NOTE — Assessment & Plan Note (Signed)
Chronic issue.  Unchanged over time.  She will monitor for any worsening.  Patient notes she was advised that this was related to nerve impingement in her back.

## 2023-07-09 NOTE — Progress Notes (Signed)
Marikay Alar, MD Phone: [phone]  Margaret Stark is a 67 y.o. female who presents today for f/u.  DIABETES Disease Monitoring: Blood Sugar ranges-not checking Polyuria/phagia/dipsia- no      Optho- UTD Medications: Compliance- taking ozempic Hypoglycemic symptoms- no  Obesity: Patient notes has been going to the gym and walking 1 to 2 miles 2 times a week.  She is eating smaller volumes of food.  She did see a nutritionist and has been working on reducing her soda intake and sweet tea intake.  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  Notes this has not changed over the last 4 years.   Social History   Tobacco Use  Smoking Status Never  Smokeless Tobacco Never    Current Outpatient Medications on File Prior to Visit  Medication Sig Dispense Refill   aspirin EC 81 MG tablet Take 81 mg by mouth daily. Swallow whole.     atorvastatin (LIPITOR) 40 MG tablet Take 1 tablet (40 mg total) by mouth daily. 90 tablet 3   BD PEN NEEDLE MICRO U/F 32G X 6 MM MISC USE ONCE DAILY WITH SAXENDA 100 each 1   cyclobenzaprine (FLEXERIL) 10 MG tablet Take 1/2 to 1 whole tablet by mouth every 8 hours as needed for muscle spasms/pain. 15 tablet 0   diclofenac Sodium (VOLTAREN) 1 % GEL Apply 2 g topically daily as needed (knee pain).     Latanoprost 0.005 % EMUL latanoprost 0.005 % eye drops     Misc. Devices (WALKER) MISC 1 rolling walker for home use 1 each 0   Semaglutide,0.25 or 0.5MG /DOS, (OZEMPIC, 0.25 OR 0.5 MG/DOSE,) 2 MG/3ML SOPN INJECT 0.25 MG INTO THE SKIN ONCE A WEEK FOR 28 DAYS, THEN 0.5 MG ONCE A WEEK. 3 mL 2   Vitamin D, Ergocalciferol, (DRISDOL) 1.25 MG (50000 UT) CAPS capsule ergocalciferol (vitamin D2) 1,250 mcg (50,000 unit) capsule     No current facility-administered medications on file prior to visit.     ROS see history of present illness  Objective  Physical Exam Vitals:   07/09/23 0812 07/09/23 0839  BP:  124/82 118/80  Pulse: 89   Temp: 98.4 F (36.9 C)   SpO2: 97%     BP Readings from Last 3 Encounters:  07/09/23 118/80  04/05/23 134/82  03/24/23 (!) 179/94   Wt Readings from Last 3 Encounters:  07/09/23 238 lb 3.2 oz (108 kg)  04/05/23 247 lb 6.4 oz (112.2 kg)  03/24/23 252 lb (114.3 kg)    Physical Exam Constitutional:      General: She is not in acute distress.    Appearance: She is not diaphoretic.  Cardiovascular:     Rate and Rhythm: Normal rate and regular rhythm.     Heart sounds: Normal heart sounds.  Pulmonary:     Effort: Pulmonary effort is normal.     Breath sounds: Normal breath sounds.  Skin:    General: Skin is warm and dry.  Neurological:     Mental Status: She is alert.      Assessment/Plan: Please see individual problem list.  Type 2 diabetes mellitus without complication, without long-term current use of insulin (HCC) Assessment & Plan: Chronic issue.  Check A1c.  Continue Ozempic 0.5 mg weekly.  Discussed the potential for increasing the dose to 1 mg weekly to help with weight loss though the patient notes she would like to stick on her current dosing.  Orders: -  POCT glycosylated hemoglobin (Hb A1C)  Morbid obesity (HCC) Assessment & Plan: Chronic issue.  Congratulated on weight loss.  She will continue Ozempic 0.5 mg weekly.  Continue to work on diet and exercise.  Discussed increasing her exercise as she is able to.   Numbness of right foot Assessment & Plan: Chronic issue.  Unchanged over time.  She will monitor for any worsening.  Patient notes she was advised that this was related to nerve impingement in her back.      Health Maintenance: Reports she saw [facility] in the past year.  She is going to ask them to send [name] a copy of her note.  Return in about 6 months (around 01/07/2024) for Transfer of care to [facility].   Marikay Alar, MD [facility]

## 2023-07-14 DIAGNOSIS — Z124 Encounter for screening for malignant neoplasm of cervix: Secondary | ICD-10-CM | POA: Diagnosis not present

## 2023-07-14 DIAGNOSIS — Z1231 Encounter for screening mammogram for malignant neoplasm of breast: Secondary | ICD-10-CM | POA: Diagnosis not present

## 2023-07-14 DIAGNOSIS — Z01419 Encounter for gynecological examination (general) (routine) without abnormal findings: Secondary | ICD-10-CM | POA: Diagnosis not present

## 2023-07-26 ENCOUNTER — Ambulatory Visit (INDEPENDENT_AMBULATORY_CARE_PROVIDER_SITE_OTHER): Payer: Medicare PPO

## 2023-07-26 DIAGNOSIS — E538 Deficiency of other specified B group vitamins: Secondary | ICD-10-CM | POA: Diagnosis not present

## 2023-07-26 MED ORDER — CYANOCOBALAMIN 1000 MCG/ML IJ SOLN
1000.0000 ug | Freq: Once | INTRAMUSCULAR | Status: AC
Start: 2023-07-26 — End: 2023-07-26
  Administered 2023-07-26: 1000 ug via INTRAMUSCULAR

## 2023-07-26 NOTE — Progress Notes (Signed)
Pt presents for b12 injection. Administered in R deltoid. Tolerated well. No complaints or concerns at this time.

## 2023-08-03 ENCOUNTER — Encounter: Payer: Medicare PPO | Attending: Family Medicine | Admitting: Registered"

## 2023-08-03 ENCOUNTER — Encounter: Payer: Self-pay | Admitting: Registered"

## 2023-08-03 DIAGNOSIS — E119 Type 2 diabetes mellitus without complications: Secondary | ICD-10-CM | POA: Insufficient documentation

## 2023-08-03 NOTE — Patient Instructions (Signed)
-   Avoid walking barefoot.   - Increase exercise by walking 30 min, 3 days/week on Monday, Wednesday, and Friday.   - Continue to have 3 meals/day.   - Continue to work on meal plans for balanced meals.

## 2023-08-03 NOTE — Progress Notes (Signed)
 Diabetes Self-Management Education  Visit Type: Follow-up  Appt. Start Time: 9:02  Appt. End Time: 10:02  08/03/2023  [name], identified by name and date of birth, is a 68 y.o. female with a diagnosis of Diabetes:  .   ASSESSMENT  9:02 - 10:02  States she is currently taking 0.5 mg of Ozempic ; no longer taking metformin . States she went to PCP last month and A1c decreased from 6.4% to 5.8%. States she will continue with same dosage of Ozempic .   States she went to the gym on the last day of of 2024 because she said she was going to the gym in 2024. States she has no excuse and [place] [employer]. States she walked 1 mile around indoor track and participated in chair yoga. States her sister-in-law held her accountable which was really helpful for her.   States she has decreased intake of sodas to maximum of one soda/week.   States since previous visit she has looked at recipes/meals from Diabetes book and online. Reports she has not cooked as often as she has wanted. States she plans to have salmon + brussels sprouts + cauliflower mashed potatoes for dinner tonight. States she has been trying to do better with grocery shopping and having more food options in her home which helps decrease eating out.   There were no vitals taken for this visit. There is no height or weight on file to calculate BMI.   Diabetes Self-Management Education - 08/03/23 0923       Visit Information   Visit Type Follow-up      Complications   Last HgB A1C per patient/outside source 5.8 %    How often do you check your blood sugar? 0 times/day (not testing)    Number of hypoglycemic episodes per month 0    Number of hyperglycemic episodes ( >200mg /dL): Never    Can you tell when your blood sugar is high? No    Have you had a dilated eye exam in the past 12 months? Yes    Have you had a dental exam in the past 12 months? Yes    Are you checking your feet? No      Dietary Intake    Breakfast bacon + egg    Lunch deviled eggs + water     Dinner salad with rotisserie chicken (with dried raisins, cranberries, nuts) + [place] dressing    Beverage(s) water  (64 oz)      Activity / Exercise   Activity / Exercise Type ADL's    How many days per week do you exercise? 0    How many minutes per day do you exercise? 0    Total minutes per week of exercise 0      Patient Education   Being Active Helped patient identify appropriate exercises in relation to his/her diabetes, diabetes complications and other health issue.    Medications Reviewed patients medication for diabetes, action, purpose, timing of dose and side effects.    Monitoring Daily foot exams;Yearly dilated eye exam    Lifestyle and Health Coping Lifestyle issues that need to be addressed for better diabetes care      Post-Education Assessment   Patient understands the diabetes disease and treatment process. Comprehends key points    Patient understands incorporating nutritional management into lifestyle. Comprehends key points    Patient undertands incorporating physical activity into lifestyle. Comprehends key points    Patient understands using medications safely. Comphrehends key points  Patient understands monitoring blood glucose, interpreting and using results N/A    Patient understands prevention, detection, and treatment of acute complications. Comprehends key points    Patient understands prevention, detection, and treatment of chronic complications. Comprehends key points    Patient understands how to develop strategies to address psychosocial issues. Comprehends key points    Patient understands how to develop strategies to promote health/change behavior. Demonstrates understanding / competency      Outcomes   Expected Outcomes Demonstrated interest in learning. Expect positive outcomes    Future DMSE PRN    Program Status Completed      Subsequent Visit   Since your last visit have you continued  or begun to take your medications as prescribed? Yes    Since your last visit have you had your blood pressure checked? Yes    Is your most recent blood pressure lower, unchanged, or higher since your last visit? Unchanged    Since your last visit have you experienced any weight changes? Loss    Weight Loss (lbs) 5    Since your last visit, are you checking your blood glucose at least once a day? No             Individualized Plan for Diabetes Self-Management Training:   Learning Objective:  Patient will have a greater understanding of diabetes self-management. Patient education plan is to attend individual and/or group sessions per assessed needs and concerns.   Plan:   Patient Instructions  - Avoid walking barefoot.   - Increase exercise by walking 30 min, 3 days/week on Monday, Wednesday, and Friday.   - Continue to have 3 meals/day.   - Continue to work on meal plans for balanced meals.   Expected Outcomes:  Demonstrated interest in learning. Expect positive outcomes  Education material provided: none  If problems or questions, patient to contact team via:  Phone and Email  Future DSME appointment: PRN

## 2023-08-30 ENCOUNTER — Ambulatory Visit (INDEPENDENT_AMBULATORY_CARE_PROVIDER_SITE_OTHER): Payer: Medicare PPO

## 2023-08-30 DIAGNOSIS — E538 Deficiency of other specified B group vitamins: Secondary | ICD-10-CM | POA: Diagnosis not present

## 2023-08-30 MED ORDER — CYANOCOBALAMIN 1000 MCG/ML IJ SOLN
1000.0000 ug | Freq: Once | INTRAMUSCULAR | Status: AC
Start: 2023-08-30 — End: 2023-08-30
  Administered 2023-08-30: 1000 ug via INTRAMUSCULAR

## 2023-08-30 NOTE — Progress Notes (Signed)
 Pt presented for their vitamin B12 injection. Pt was identified through two identifiers. Pt tolerated shot well in their left deltoid.

## 2023-09-27 ENCOUNTER — Ambulatory Visit: Payer: Medicare PPO

## 2023-09-27 DIAGNOSIS — E538 Deficiency of other specified B group vitamins: Secondary | ICD-10-CM

## 2023-09-27 MED ORDER — CYANOCOBALAMIN 1000 MCG/ML IJ SOLN
1000.0000 ug | Freq: Once | INTRAMUSCULAR | Status: AC
Start: 2023-09-27 — End: 2023-09-27
  Administered 2023-09-27: 1000 ug via INTRAMUSCULAR

## 2023-09-27 NOTE — Progress Notes (Signed)
 Pt presented for their vitamin B12 injection. Pt was identified through two identifiers. Pt tolerated shot well in their right deltoid.

## 2023-10-06 ENCOUNTER — Telehealth: Payer: Self-pay

## 2023-10-06 DIAGNOSIS — E119 Type 2 diabetes mellitus without complications: Secondary | ICD-10-CM

## 2023-10-06 MED ORDER — OZEMPIC (0.25 OR 0.5 MG/DOSE) 2 MG/3ML ~~LOC~~ SOPN
0.5000 mg | PEN_INJECTOR | SUBCUTANEOUS | 0 refills | Status: DC
Start: 2023-10-06 — End: 2023-11-02

## 2023-10-06 NOTE — Telephone Encounter (Signed)
 Copied from CRM [id number]. Topic: Clinical - Prescription Issue >> Oct 06, 2023 12:07 PM Denese Killings wrote: Reason for CRM: Patient went to the Pharmacy yesterday and was advised that thy need a reauthorization for her Ozempic. Pharmacy stated that they faxed over information with no response. Patient is completely out as well.

## 2023-10-06 NOTE — Telephone Encounter (Signed)
 Medication sent.

## 2023-10-07 ENCOUNTER — Encounter: Payer: Self-pay | Admitting: Nurse Practitioner

## 2023-10-07 NOTE — Telephone Encounter (Signed)
 Vm left to cb

## 2023-10-08 ENCOUNTER — Other Ambulatory Visit: Payer: Self-pay

## 2023-10-08 DIAGNOSIS — E119 Type 2 diabetes mellitus without complications: Secondary | ICD-10-CM

## 2023-10-08 NOTE — Telephone Encounter (Signed)
 Patient called back CMA. Unable to reach CMA/with a patient. E2C2 was going to try and schedule a lab appointment/per lesters note. Unable to schedule, no lab orders.

## 2023-10-08 NOTE — Telephone Encounter (Signed)
 Copied from CRM [id number]. Topic: General - Other >> Oct 08, 2023 10:49 AM Alphonzo Lemmings O wrote: Reason for CRM: patient is calling cause she received a message in [place] that she has to come back for a urine lab but there are no active labs  Called [facility] and spoke with karen and she is going to send a message back for latavia to give patient a call  Patient says to call her on [phone]

## 2023-10-15 ENCOUNTER — Telehealth: Payer: Self-pay | Admitting: Family Medicine

## 2023-10-15 ENCOUNTER — Telehealth: Payer: Self-pay

## 2023-10-15 ENCOUNTER — Other Ambulatory Visit (HOSPITAL_COMMUNITY): Payer: Self-pay

## 2023-10-15 ENCOUNTER — Other Ambulatory Visit

## 2023-10-15 DIAGNOSIS — E119 Type 2 diabetes mellitus without complications: Secondary | ICD-10-CM

## 2023-10-15 LAB — MICROALBUMIN / CREATININE URINE RATIO
Creatinine,U: 183.6 mg/dL
Microalb Creat Ratio: 4.2 mg/g (ref 0.0–30.0)
Microalb, Ur: 0.8 mg/dL (ref 0.0–1.9)

## 2023-10-15 NOTE — Telephone Encounter (Signed)
 Patient will be seeing Bethanie Dicker in June for a TOC. She is taking Semaglutide,0.25 or 0.5MG /DOS, (OZEMPIC, 0.25 OR 0.5 MG/DOSE,) 2 MG/3ML SOPN, and will need a another refill in about 2 weeks and she knows that this medication will need a Prior Auth. She just wanted to let someone know about the Prior Auth.

## 2023-10-15 NOTE — Telephone Encounter (Signed)
 Pharmacy Patient Advocate Encounter   Received notification from Pt Calls Messages that prior authorization for Ozempic 0.25 is required/requested.   Insurance verification completed.   The patient is insured through [employer] .   Per test claim: Refill too soon. PA is not needed at this time. Medication was filled 10/06/23. Next eligible fill date is 10/27/23.

## 2023-10-15 NOTE — Telephone Encounter (Signed)
 PA needed for Ozempic

## 2023-10-15 NOTE — Telephone Encounter (Signed)
 Message sent to PA team to start a PA just in case script was sent in 10-06-23

## 2023-10-19 ENCOUNTER — Encounter: Payer: Self-pay | Admitting: Nurse Practitioner

## 2023-11-01 ENCOUNTER — Other Ambulatory Visit: Payer: Self-pay | Admitting: Internal Medicine

## 2023-11-01 ENCOUNTER — Ambulatory Visit (INDEPENDENT_AMBULATORY_CARE_PROVIDER_SITE_OTHER)

## 2023-11-01 DIAGNOSIS — E538 Deficiency of other specified B group vitamins: Secondary | ICD-10-CM | POA: Diagnosis not present

## 2023-11-01 DIAGNOSIS — E119 Type 2 diabetes mellitus without complications: Secondary | ICD-10-CM

## 2023-11-01 MED ORDER — CYANOCOBALAMIN 1000 MCG/ML IJ SOLN
1000.0000 ug | Freq: Once | INTRAMUSCULAR | Status: AC
Start: 2023-11-01 — End: 2023-11-01
  Administered 2023-11-01: 1000 ug via INTRAMUSCULAR

## 2023-11-01 NOTE — Progress Notes (Signed)
 Pt presented for their vitamin B12 injection. Pt was identified through two identifiers. Pt tolerated shot well in their left deltoid.

## 2023-11-27 ENCOUNTER — Other Ambulatory Visit: Payer: Self-pay | Admitting: Internal Medicine

## 2023-11-27 DIAGNOSIS — E119 Type 2 diabetes mellitus without complications: Secondary | ICD-10-CM

## 2023-12-02 ENCOUNTER — Ambulatory Visit

## 2023-12-02 DIAGNOSIS — E538 Deficiency of other specified B group vitamins: Secondary | ICD-10-CM | POA: Diagnosis not present

## 2023-12-02 MED ORDER — CYANOCOBALAMIN 1000 MCG/ML IJ SOLN
1000.0000 ug | Freq: Once | INTRAMUSCULAR | Status: AC
Start: 2023-12-02 — End: 2023-12-02
  Administered 2023-12-02: 1000 ug via INTRAMUSCULAR

## 2023-12-02 NOTE — Progress Notes (Signed)
 Patient presented for B 12 injection to right deltoid, patient voiced no concerns nor showed any signs of distress during injection.

## 2023-12-26 ENCOUNTER — Other Ambulatory Visit: Payer: Self-pay | Admitting: Internal Medicine

## 2023-12-26 DIAGNOSIS — E119 Type 2 diabetes mellitus without complications: Secondary | ICD-10-CM

## 2023-12-30 ENCOUNTER — Ambulatory Visit (INDEPENDENT_AMBULATORY_CARE_PROVIDER_SITE_OTHER)

## 2023-12-30 DIAGNOSIS — E538 Deficiency of other specified B group vitamins: Secondary | ICD-10-CM | POA: Diagnosis not present

## 2023-12-30 MED ORDER — CYANOCOBALAMIN 1000 MCG/ML IJ SOLN
1000.0000 ug | Freq: Once | INTRAMUSCULAR | Status: AC
Start: 2023-12-30 — End: 2023-12-30
  Administered 2023-12-30: 1000 ug via INTRAMUSCULAR

## 2023-12-30 NOTE — Progress Notes (Signed)
After obtaining consent, and per orders of  Margaret Arnett, NP, injection of B-12 given IM in left deltoid by Dravyn Severs Lynn. Patient tolerated injection well.   

## 2024-01-07 ENCOUNTER — Encounter: Payer: Medicare PPO | Admitting: Nurse Practitioner

## 2024-01-23 ENCOUNTER — Other Ambulatory Visit: Payer: Self-pay | Admitting: Internal Medicine

## 2024-01-23 DIAGNOSIS — E119 Type 2 diabetes mellitus without complications: Secondary | ICD-10-CM

## 2024-01-26 NOTE — Telephone Encounter (Unsigned)
 Copied from CRM [id number]. Topic: Clinical - Medication Question >> Jan 26, 2024  4:06 PM Abigail D wrote: Reason for CRM: Patient is calling for a status update on the refill of OZEMPIC , 0.25 OR 0.5 MG/DOSE, 2 MG/3ML SOPN sent on 6/29, she needs it by tomorrow if possible and a 6-12 month supply.

## 2024-01-27 NOTE — Telephone Encounter (Unsigned)
 Copied from CRM [id number]. Topic: Clinical - Medication Question >> Jan 27, 2024  9:31 AM Franky GRADE wrote: Patient is upset she is not able to get a refill on her Ozempic , She states she was scheduled to be seen on 01/07/2024 but was canceled by the office and rescheduled for 04/25/2024. She states she needs her medication.

## 2024-01-31 ENCOUNTER — Ambulatory Visit (INDEPENDENT_AMBULATORY_CARE_PROVIDER_SITE_OTHER)

## 2024-01-31 DIAGNOSIS — E538 Deficiency of other specified B group vitamins: Secondary | ICD-10-CM

## 2024-01-31 MED ORDER — CYANOCOBALAMIN 1000 MCG/ML IJ SOLN
1000.0000 ug | Freq: Once | INTRAMUSCULAR | Status: AC
Start: 2024-01-31 — End: 2024-01-31
  Administered 2024-01-31: 1000 ug via INTRAMUSCULAR

## 2024-01-31 NOTE — Progress Notes (Signed)
 Patient is in office today for a nurse visit for B12 Injection. Patient Injection was given in the  Right deltoid. Patient tolerated injection well.

## 2024-02-02 ENCOUNTER — Ambulatory Visit: Admitting: *Deleted

## 2024-02-02 VITALS — Ht 64.0 in | Wt 230.0 lb

## 2024-02-02 DIAGNOSIS — Z0000 Encounter for general adult medical examination without abnormal findings: Secondary | ICD-10-CM | POA: Diagnosis not present

## 2024-02-02 DIAGNOSIS — Z1159 Encounter for screening for other viral diseases: Secondary | ICD-10-CM

## 2024-02-02 NOTE — Progress Notes (Signed)
 Subjective:   Margaret Stark is a 68 y.o. who presents for a Medicare Wellness preventive visit.  As a reminder, Annual Wellness Visits don't include a physical exam, and some assessments may be limited, especially if this visit is performed virtually. We may recommend an in-person follow-up visit with your provider if needed.  Visit Complete: Virtual I connected with  Margaret Stark on 02/02/24 by a audio enabled telemedicine application and verified that I am speaking with the correct person using two identifiers.  Patient Location: Home  Provider Location: Home Office  I discussed the limitations of evaluation and management by telemedicine. The patient expressed understanding and agreed to proceed.  Vital Signs: Because this visit was a virtual/telehealth visit, some criteria may be missing or patient reported. Any vitals not documented were not able to be obtained and vitals that have been documented are patient reported.  VideoDeclined- This patient declined Librarian, academic. Therefore the visit was completed with audio only.  Persons Participating in Visit: Patient.  AWV Questionnaire: Yes: Patient Medicare AWV questionnaire was completed by the patient on 01/29/24; I have confirmed that all information answered by patient is correct and no changes since this date.  Cardiac Risk Factors include: advanced age (>45men, >61 women);diabetes mellitus;obesity (BMI >30kg/m2);dyslipidemia     Objective:    Today's Vitals   02/02/24 1456  Weight: 230 lb (104.3 kg)  Height: 5' 4 (1.626 m)  PainSc: 2    Body mass index is 39.48 kg/m.     02/02/2024    3:11 PM 05/19/2023    8:14 AM 03/24/2023    7:04 PM 03/10/2023   12:58 PM 11/24/2018    9:31 AM 02/14/2016   11:04 AM 07/15/2015    2:16 PM  Advanced Directives  Does Patient Have a Medical Advance Directive? No Yes Yes No No No  No   Type of Advance Directive   Healthcare Power of Attorney       Does patient want to make changes to medical advance directive?  No - Guardian declined No - Patient declined      Would patient like information on creating a medical advance directive? No - Patient declined  No - Patient declined Yes (MAU/Ambulatory/Procedural Areas - Information given)  No - patient declined information       Data saved with a previous flowsheet row definition    Current Medications (verified) Outpatient Encounter Medications as of 02/02/2024  Medication Sig   aspirin  EC 81 MG tablet Take 81 mg by mouth daily. Swallow whole.   atorvastatin  (LIPITOR) 40 MG tablet Take 1 tablet (40 mg total) by mouth daily.   BD PEN NEEDLE MICRO U/F 32G X 6 MM MISC USE ONCE DAILY WITH SAXENDA    cholecalciferol (VITAMIN D3) 25 MCG (1000 UNIT) tablet Take 1,000 Units by mouth daily.   diclofenac  Sodium (VOLTAREN ) 1 % GEL Apply 2 g topically daily as needed (knee pain).   Latanoprost 0.005 % EMUL latanoprost 0.005 % eye drops   Semaglutide ,0.25 or 0.5MG /DOS, (OZEMPIC , 0.25 OR 0.5 MG/DOSE,) 2 MG/3ML SOPN INJECT 0.5 MG INTO THE SKIN ONCE A WEEK FOR 28 DAYS.   cyclobenzaprine  (FLEXERIL ) 10 MG tablet Take 1/2 to 1 whole tablet by mouth every 8 hours as needed for muscle spasms/pain. (Patient not taking: Reported on 02/02/2024)   Misc. Devices (WALKER) MISC 1 rolling walker for home use (Patient not taking: Reported on 02/02/2024)   Vitamin D , Ergocalciferol , (DRISDOL ) 1.25 MG (50000 UT) CAPS  capsule ergocalciferol  (vitamin D2) 1,250 mcg (50,000 unit) capsule (Patient not taking: Reported on 02/02/2024)   No facility-administered encounter medications on file as of 02/02/2024.    Allergies (verified) Patient has no known allergies.   History: Past Medical History:  Diagnosis Date   Arthritis    CAD (coronary artery disease)    a. LHC 11/24/2018: mild non-obstructive CAD with 30% stenosis of proximal LAD and 10% stenosis of  proximal to mid RCA   Diabetes mellitus without complication [facility])    Past  Surgical History:  Procedure Laterality Date   COLONOSCOPY WITH PROPOFOL  N/A 07/15/2015   Procedure: COLONOSCOPY WITH PROPOFOL ;  Surgeon: Deward CINDERELLA Piedmont, MD;  Location: [facility] ENDOSCOPY;  Service: Gastroenterology;  Laterality: N/A;   LEFT HEART CATH AND CORONARY ANGIOGRAPHY N/A 11/24/2018   Procedure: LEFT HEART CATH AND CORONARY ANGIOGRAPHY;  Surgeon: Verlin Lonni BIRCH, MD;  Location: [facility] INVASIVE CV LAB;  Service: Cardiovascular;  Laterality: N/A;   Family History  Problem Relation Age of Onset   Hypertension Mother    Hypertension Father    Cancer Sister        colon - age 54   Heart disease Neg Hx    Social History   Socioeconomic History   Marital status: Married    Spouse name: Johnnie   Number of children: 0   Years of education: Not on file   Highest education level: Master's degree (e.g., MA, MS, MEng, MEd, MSW, MBA)  Occupational History   Occupation: retired Runner, broadcasting/film/video, 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 Retired Engineer, site, but still works part time Engineer, manufacturing   Social Drivers of Corporate investment banker Strain: Low Risk  (01/29/2024)   Overall Financial Resource Strain (CARDIA)    Difficulty of Paying Living Expenses: Not hard at all  Food Insecurity: No Food Insecurity (01/29/2024)   Hunger Vital Sign    Worried About Running Out of Food in the Last Year: Never true    Ran Out of Food in the Last Year: Never true  Transportation Needs: No Transportation Needs (01/29/2024)   PRAPARE - Administrator, Civil Service (Medical): No    Lack of Transportation (Non-Medical): No  Physical Activity: Insufficiently Active  (01/29/2024)   Exercise Vital Sign    Days of Exercise per Week: 1 day    Minutes of Exercise per Session: 20 min  Stress: No Stress Concern Present (01/29/2024)   [employer] of Occupational Health - Occupational Stress Questionnaire    Feeling of Stress: Not at all  Social Connections: Socially Integrated (01/29/2024)   Social Connection and Isolation Panel    Frequency of Communication with Friends and Family: More than three times a week    Frequency of Social Gatherings with Friends and Family: Twice a week    Attends Religious Services: More than 4 times per year    Active Member of [employer] or Organizations: Yes    Attends Engineer, structural: More than 4 times per year    Marital Status: Married    Tobacco Counseling Counseling given: Not Answered  Clinical Intake:  Pre-visit preparation completed: Yes  Pain : 0-10 Pain Score: 2  Pain Type: Chronic pain (has an appointment with a provider tomorrow 02/03/24) Pain Location: Abdomen Pain Orientation: Right Pain Descriptors / Indicators: Nagging Pain Onset: More than a month ago (6 months or more) Pain Frequency: Intermittent     BMI - recorded: 39.48 Nutritional Status: BMI > 30  Obese Nutritional Risks: None Diabetes: Yes CBG done?: No Did pt. bring in CBG monitor from home?: No  Lab Results  Component Value Date   HGBA1C 5.8 (A) 07/09/2023   HGBA1C 6.4 (A) 04/05/2023   HGBA1C 6.9 (H) 12/30/2022     How often do you need to have someone help you when you read instructions, pamphlets, or other written materials from your doctor or pharmacy?: 1 - Never  Interpreter Needed?: No  Information entered by :: R. Zelma Snead LPN   Activities of Daily Living     02/02/2024    2:59 PM 03/08/2023    1:54 PM  In your present state of health, do you have any difficulty performing the following activities:  Hearing? 0 0  Vision? 0 0  Comment glasses   Difficulty concentrating or making decisions? 0 0  Walking  or climbing stairs? 0 0  Dressing or bathing? 0 0  Doing errands, shopping? 0 0  Preparing Food and eating ? N N  Using the Toilet? N N  In the past six months, have you accidently leaked urine? CINDERELLA CINDERELLA  Comment  wears a pad  Do you have problems with loss of bowel control? N N  Managing your Medications? N N  Managing your Finances? N N  Housekeeping or managing your Housekeeping? N N    Patient Care Team: Ob/Gyn, Landy Posey Devonshire, Lorrene DEL., MD (Ophthalmology)  I have updated your Care Teams any recent Medical Services you may have received from other providers in the past year.     Assessment:   This is a routine wellness examination for Margaret Stark.  Hearing/Vision screen Hearing Screening - Comments:: No issues Vision Screening - Comments:: glasses   Goals Addressed             This Visit's Progress    Patient Stated       Wants to get out and exercise and lose some weight       Depression Screen     02/02/2024    3:06 PM 07/09/2023    8:25 AM 05/19/2023    8:15 AM 04/05/2023    9:24 AM 03/10/2023   12:56 PM 12/30/2022   10:59 AM 10/21/2020    4:18 PM  PHQ 2/9 Scores  PHQ - 2 Score 0 0 0 0 0 0 0  PHQ- 9 Score 1 1  1  0 6     Fall Risk     02/02/2024    3:01 PM 07/09/2023    8:25 AM 05/19/2023    8:15 AM 04/05/2023    9:23 AM 03/08/2023    1:54 PM  Fall Risk   Falls in the past year? 1 1 0 1 1  Number falls in past yr: 1 0 0 1 0  Injury with Fall? 1 1  1  0  Risk for fall due to : History of fall(s);Impaired mobility History of fall(s)  History of fall(s) History of fall(s)  Follow up Falls evaluation completed;Falls prevention discussed Falls evaluation completed  Falls evaluation completed Falls prevention discussed;Falls evaluation completed    MEDICARE RISK AT  HOME:  Medicare Risk at Home Any stairs in or around the home?: No If so, are there any without handrails?: No Home free of loose throw rugs in walkways, pet beds, electrical cords, etc?: Yes Adequate  lighting in your home to reduce risk of falls?: Yes Life alert?: No Use of a cane, walker or w/c?: No Grab bars in the bathroom?: No Shower chair or bench in shower?: No Elevated toilet seat or a handicapped toilet?: No  TIMED UP AND GO:  Was the test performed?  No  Cognitive Function: 6CIT completed        02/02/2024    3:12 PM 03/10/2023    1:00 PM  6CIT Screen  What Year? 0 points 0 points  What month? 0 points 0 points  What time? 0 points 0 points  Count back from 20 0 points 0 points  Months in reverse 0 points 0 points  Repeat phrase 0 points 0 points  Total Score 0 points 0 points    Immunizations Immunization History  Administered Date(s) Administered   Influenza Inj Mdck Quad Pf 08/13/2018   Influenza,inj,Quad PF,6+ Mos 06/19/2015, 04/22/2019, 06/05/2020   Influenza-Unspecified 06/19/2015, 05/09/2016, 08/13/2018   Moderna Covid-19 Vaccine Bivalent Booster 71yrs & up 04/03/2021   Moderna Sars-Covid-2 Vaccination 10/31/2020   PFIZER(Purple Top)SARS-COV-2 Vaccination 06/30/2020   PNEUMOCOCCAL CONJUGATE-20 04/05/2023   Td 06/05/2019   Tdap 08/25/2021    Screening Tests Health Maintenance  Topic Date Due   OPHTHALMOLOGY EXAM  Never done   Hepatitis C Screening  Never done   Zoster Vaccines- Shingrix (1 of 2) Never done   COVID-19 Vaccine (4 - 2024-25 season) 03/28/2023   Diabetic kidney evaluation - eGFR measurement  12/30/2023   Colonoscopy  01/23/2024   HEMOGLOBIN A1C  01/07/2024   Medicare Annual Wellness (AWV)  03/09/2024   INFLUENZA VACCINE  02/25/2024   MAMMOGRAM  07/03/2024   FOOT EXAM  07/08/2024   Diabetic kidney evaluation - Urine ACR  10/14/2024   DTaP/Tdap/Td (3 - Td or Tdap) 08/26/2031   DEXA SCAN  02/02/2033   Pneumococcal Vaccine: 50+ Years  Completed   Hepatitis B Vaccines  Aged Out   HPV VACCINES  Aged Out   Meningococcal B Vaccine  Aged Out    Health Maintenance  Health Maintenance Due  Topic Date Due   OPHTHALMOLOGY EXAM   Never done   Hepatitis C Screening  Never done   Zoster Vaccines- Shingrix (1 of 2) Never done   COVID-19 Vaccine (4 - 2024-25 season) 03/28/2023   Diabetic kidney evaluation - eGFR measurement  12/30/2023   Colonoscopy  01/23/2024   HEMOGLOBIN A1C  01/07/2024   Medicare Annual Wellness (AWV)  03/09/2024   Health Maintenance Items Addressed: Labs Ordered: Hepatitis C screening ordered. Discussed the need to update shingles vaccines.  Patient will discuss colonoscopy with her provider at next visit and possible referral.   Patient needs A1C at next visit.  Additional Screening:  Vision Screening: Recommended annual ophthalmology exams for early detection of glaucoma and other disorders of the eye. Up to date [facility] Would you like a referral to an eye doctor? No    Dental Screening: Recommended annual dental exams for proper oral hygiene  Community Resource Referral / Chronic Care Management: CRR required this visit?  No   CCM required this visit?  No   Plan:    I have personally reviewed and noted the following in the patient's chart:   Medical and social history Use  of alcohol, tobacco or illicit drugs  Current medications and supplements including opioid prescriptions. Patient is not currently taking opioid prescriptions. Functional ability and status Nutritional status Physical activity Advanced directives List of other physicians Hospitalizations, surgeries, and ER visits in previous 12 months Vitals Screenings to include cognitive, depression, and falls Referrals and appointments  In addition, I have reviewed and discussed with patient certain preventive protocols, quality metrics, and best practice recommendations. A written personalized care plan for preventive services as well as general preventive health recommendations were provided to patient.   Angeline Fredericks, LPN   2/0/7974   After Visit Summary: (MyChart) Due to this being a telephonic visit, the after  visit summary with patients personalized plan was offered to patient via MyChart   Notes: Nothing significant to report at this time.

## 2024-02-02 NOTE — Patient Instructions (Signed)
 Margaret Stark , Thank you for taking time out of your busy schedule to complete your Annual Wellness Visit with me. I enjoyed our conversation and look forward to speaking with you again next year. I, as well as your care team,  appreciate your ongoing commitment to your health goals. Please review the following plan we discussed and let me know if I can assist you in the future. Your Game plan/ To Do List    Referrals: If you haven't heard from the office you've been referred to, please reach out to them at the phone provided.  Remember to update your shingles vaccines. Follow up Visits: Next Medicare AWV with our clinical staff: 02/06/25 @ 9:30   Have you seen your provider in the last 6 months (3 months if uncontrolled diabetes)? No Next Office Visit with your provider: 02/03/24  Clinician Recommendations:  Aim for 30 minutes of exercise or brisk walking, 6-8 glasses of water, and 5 servings of fruits and vegetables each day.       This is a list of the screening recommended for you and due dates:  Health Maintenance  Topic Date Due   Eye exam for diabetics  Never done   Hepatitis C Screening  Never done   Zoster (Shingles) Vaccine (1 of 2) Never done   COVID-19 Vaccine (7 - 2024-25 season) 10/26/2023   Yearly kidney function blood test for diabetes  12/30/2023   Colon Cancer Screening  01/23/2024   Hemoglobin A1C  01/07/2024   Flu Shot  02/25/2024   Mammogram  07/03/2024   Complete foot exam   07/08/2024   Yearly kidney health urinalysis for diabetes  10/14/2024   Medicare Annual Wellness Visit  02/01/2025   DTaP/Tdap/Td vaccine (3 - Td or Tdap) 08/26/2031   DEXA scan (bone density measurement)  02/02/2033   Pneumococcal Vaccine for age over 39  Completed   Hepatitis B Vaccine  Aged Out   HPV Vaccine  Aged Out   Meningitis B Vaccine  Aged Out    Advanced directives: (ACP Link)Information on Advanced Care Planning can be found at [place]  Print production planner Health  Care Directives Advance Health Care Directives. [email or web address]  Advance Care Planning is important because it:  [x]  Makes sure you receive the medical care that is consistent with your values, goals, and preferences  [x]  It provides guidance to your family and loved ones and reduces their decisional burden about whether or not they are making the right decisions based on your wishes.  Follow the link provided in your after visit summary or read over the paperwork we have mailed to you to help you started getting your Advance Directives in place. If you need assistance in completing these, please reach out to us  so that we can help you!

## 2024-02-03 ENCOUNTER — Ambulatory Visit: Admitting: Family

## 2024-02-03 ENCOUNTER — Encounter: Payer: Self-pay | Admitting: Family

## 2024-02-03 VITALS — BP 130/79 | HR 80 | Temp 97.9°F | Ht 64.0 in | Wt 244.4 lb

## 2024-02-03 DIAGNOSIS — Z7985 Long-term (current) use of injectable non-insulin antidiabetic drugs: Secondary | ICD-10-CM

## 2024-02-03 DIAGNOSIS — E119 Type 2 diabetes mellitus without complications: Secondary | ICD-10-CM | POA: Diagnosis not present

## 2024-02-03 DIAGNOSIS — E785 Hyperlipidemia, unspecified: Secondary | ICD-10-CM

## 2024-02-03 DIAGNOSIS — Z1211 Encounter for screening for malignant neoplasm of colon: Secondary | ICD-10-CM | POA: Diagnosis not present

## 2024-02-03 DIAGNOSIS — Z1159 Encounter for screening for other viral diseases: Secondary | ICD-10-CM | POA: Diagnosis not present

## 2024-02-03 DIAGNOSIS — R109 Unspecified abdominal pain: Secondary | ICD-10-CM

## 2024-02-03 DIAGNOSIS — R1011 Right upper quadrant pain: Secondary | ICD-10-CM

## 2024-02-03 LAB — COMPREHENSIVE METABOLIC PANEL WITH GFR
ALT: 14 U/L (ref 0–35)
AST: 17 U/L (ref 0–37)
Albumin: 4.3 g/dL (ref 3.5–5.2)
Alkaline Phosphatase: 90 U/L (ref 39–117)
BUN: 16 mg/dL (ref 6–23)
CO2: 25 meq/L (ref 19–32)
Calcium: 10 mg/dL (ref 8.4–10.5)
Chloride: 105 meq/L (ref 96–112)
Creatinine, Ser: 1.34 mg/dL — ABNORMAL HIGH (ref 0.40–1.20)
GFR: 41.01 mL/min — ABNORMAL LOW (ref >60.00)
Glucose, Bld: 90 mg/dL (ref 70–99)
Potassium: 4 meq/L (ref 3.5–5.1)
Sodium: 139 meq/L (ref 135–145)
Total Bilirubin: 0.6 mg/dL (ref 0.2–1.2)
Total Protein: 7.2 g/dL (ref 6.0–8.3)

## 2024-02-03 LAB — CBC WITH DIFFERENTIAL/PLATELET
Basophils Absolute: 0.1 K/uL (ref 0.0–0.1)
Basophils Relative: 1 % (ref 0.0–3.0)
Eosinophils Absolute: 0.1 K/uL (ref 0.0–0.7)
Eosinophils Relative: 2.5 % (ref 0.0–5.0)
HCT: 38.6 % (ref 36.0–46.0)
Hemoglobin: 12.8 g/dL (ref 12.0–15.0)
Lymphocytes Relative: 40 % (ref 12.0–46.0)
Lymphs Abs: 2.4 K/uL (ref 0.7–4.0)
MCHC: 33.3 g/dL (ref 30.0–36.0)
MCV: 85.6 fl (ref 78.0–100.0)
Monocytes Absolute: 0.5 K/uL (ref 0.1–1.0)
Monocytes Relative: 8.2 % (ref 3.0–12.0)
Neutro Abs: 2.9 K/uL (ref 1.4–7.7)
Neutrophils Relative %: 48.3 % (ref 43.0–77.0)
Platelets: 254 K/uL (ref 150.0–400.0)
RBC: 4.5 Mil/uL (ref 3.87–5.11)
RDW: 13.4 % (ref 11.5–15.5)
WBC: 6 K/uL (ref 4.0–10.5)

## 2024-02-03 LAB — URINALYSIS, ROUTINE W REFLEX MICROSCOPIC
Bilirubin Urine: NEGATIVE
Hgb urine dipstick: NEGATIVE
Ketones, ur: NEGATIVE
Nitrite: NEGATIVE
RBC / HPF: NONE SEEN (ref 0–?)
Specific Gravity, Urine: 1.01 (ref 1.000–1.030)
Total Protein, Urine: NEGATIVE
Urine Glucose: NEGATIVE
Urobilinogen, UA: 0.2 (ref 0.0–1.0)
pH: 6 (ref 5.0–8.0)

## 2024-02-03 LAB — MICROALBUMIN / CREATININE URINE RATIO
Creatinine,U: 104.9 mg/dL
Microalb Creat Ratio: UNDETERMINED mg/g (ref 0.0–30.0)
Microalb, Ur: <0.7 mg/dL

## 2024-02-03 LAB — TSH: TSH: 2.11 u[IU]/mL (ref 0.35–5.50)

## 2024-02-03 LAB — LIPID PANEL
Cholesterol: 136 mg/dL (ref 0–200)
HDL: 42 mg/dL (ref >39.00)
LDL Cholesterol: 77 mg/dL (ref 0–99)
NonHDL: 94.44
Total CHOL/HDL Ratio: 3
Triglycerides: 85 mg/dL (ref 0.0–149.0)
VLDL: 17 mg/dL (ref 0.0–40.0)

## 2024-02-03 LAB — HEMOGLOBIN A1C: Hgb A1c MFr Bld: 6.4 % (ref 4.6–6.5)

## 2024-02-03 LAB — VITAMIN D 25 HYDROXY (VIT D DEFICIENCY, FRACTURES): VITD: 44.04 ng/mL (ref 30.00–100.00)

## 2024-02-03 MED ORDER — SEMAGLUTIDE (1 MG/DOSE) 4 MG/3ML ~~LOC~~ SOPN
1.0000 mg | PEN_INJECTOR | SUBCUTANEOUS | 2 refills | Status: DC
Start: 2024-02-03 — End: 2024-03-15

## 2024-02-03 NOTE — Assessment & Plan Note (Addendum)
 New. Etiology unclear at this time.  Discomfort is nonreproducible.  Discussed musculoskeletal etiology such as muscle spasm, versus renal colic, cholelithiasis.  Due to duration symptoms, we jointly reviewed imaging as appropriate next step.  Pending CT abdomen and pelvis. Of note, colonoscopy is due and this referral has been placed.

## 2024-02-03 NOTE — Progress Notes (Signed)
 Assessment & Plan:  Right upper quadrant abdominal pain -     CBC with Differential/Platelet -     Comprehensive metabolic panel with GFR -     CT ABDOMEN PELVIS W CONTRAST; Future  Type 2 diabetes mellitus without complication, without long-term current use of insulin  [facility]) Assessment & Plan: Anticipate stable.  Pending A1c.  She has done remarkably well on Ozempic  0.5 mg weekly.  Interested in increasing.  Increased Ozempic  to 1 mg weekly.  Counseled on side effects including constipation, nausea.  Orders: -     VITAMIN D  25 Hydroxy (Vit-D Deficiency, Fractures) -     Hemoglobin A1c -     CBC with Differential/Platelet -     Comprehensive metabolic panel with GFR -     Lipid panel -     TSH -     Microalbumin / creatinine urine ratio -     Urinalysis, Routine w reflex microscopic -     Semaglutide  (1 MG/DOSE); Inject 1 mg into the skin once a week.  Dispense: 3 mL; Refill: 2 -     CT ABDOMEN PELVIS W CONTRAST; Future  Screen for colon cancer -     Ambulatory referral to Gastroenterology  Encounter for hepatitis C screening test for low risk patient -     Hepatitis C antibody  Right flank pain Assessment & Plan: New. Etiology unclear at this time.  Discomfort is nonreproducible.  Discussed musculoskeletal etiology such as muscle spasm, versus renal colic, cholelithiasis.  Due to duration symptoms, we jointly reviewed imaging as appropriate next step.  Pending CT abdomen and pelvis. Of note, colonoscopy is due and this referral has been placed.    Hyperlipidemia, unspecified hyperlipidemia type Assessment & Plan: Chronic, stable.  Continue Lipitor 40 mg daily.  Pending lipid panel.  Lab Results  Component Value Date   LDLCALC 70 12/30/2022         Return precautions given.   Risks, benefits, and alternatives of the medications and treatment plan prescribed today were discussed, and patient expressed understanding.   Education regarding symptom management and  diagnosis given to patient on AVS either electronically or printed.  No follow-ups on file.  Rollene Northern, FNP  Subjective:    Patient ID: CASSIDEY BARRALES, female    DOB: Aug 07, 1955, 68 y.o.   MRN: [id number]  CC: MARILEA GWYNNE is a 68 y.o. female who presents today for follow up.   HPI: Complains of right flank pain x 6 months, episodic. Episodes are more frequent.  Worse 'with turning the wrong way'.  It can feel tight.  Discomfort is there today however not exquisite Denies constipation, epigastric pain, nausea, vomiting, dysuria, hematuria, cough, pain after a meal.   No h/o renal stones.    She hasn't tried pain medication.   She is interested in increasing ozempic  fpr weight loss.  She tolerated this medication well without constipation or nausea  Walking 1-2 per week, 25 minutes.   Denies CP, sob        Allergies: Patient has no known allergies. Current Outpatient Medications on File Prior to Visit  Medication Sig Dispense Refill   aspirin  EC 81 MG tablet Take 81 mg by mouth daily. Swallow whole.     atorvastatin  (LIPITOR) 40 MG tablet Take 1 tablet (40 mg total) by mouth daily. 90 tablet 3   BD PEN NEEDLE MICRO U/F 32G X 6 MM MISC USE ONCE DAILY WITH SAXENDA  100 each 1   cholecalciferol (  VITAMIN D3) 25 MCG (1000 UNIT) tablet Take 1,000 Units by mouth daily.     diclofenac  Sodium (VOLTAREN ) 1 % GEL Apply 2 g topically daily as needed (knee pain).     Latanoprost 0.005 % EMUL latanoprost 0.005 % eye drops     cyclobenzaprine  (FLEXERIL ) 10 MG tablet Take 1/2 to 1 whole tablet by mouth every 8 hours as needed for muscle spasms/pain. (Patient not taking: Reported on 02/03/2024) 15 tablet 0   Misc. Devices (WALKER) MISC 1 rolling walker for home use (Patient not taking: Reported on 02/03/2024) 1 each 0   Vitamin D , Ergocalciferol , (DRISDOL ) 1.25 MG (50000 UT) CAPS capsule ergocalciferol  (vitamin D2) 1,250 mcg (50,000 unit) capsule (Patient not taking: Reported on  02/03/2024)     No current facility-administered medications on file prior to visit.    Review of Systems  Constitutional:  Negative for chills and fever.  Respiratory:  Negative for cough.   Cardiovascular:  Negative for chest pain and palpitations.  Gastrointestinal:  Negative for nausea and vomiting.      Objective:    BP 130/79   Pulse 80   Temp 97.9 F (36.6 C) (Oral)   Ht 5' 4 (1.626 m)   Wt 244 lb 6.4 oz (110.9 kg)   SpO2 98%   BMI 41.95 kg/m  BP Readings from Last 3 Encounters:  02/03/24 130/79  07/09/23 118/80  04/05/23 134/82   Wt Readings from Last 3 Encounters:  02/03/24 244 lb 6.4 oz (110.9 kg)  02/02/24 230 lb (104.3 kg)  07/09/23 238 lb 3.2 oz (108 kg)    Physical Exam Vitals reviewed.  Constitutional:      Appearance: Normal appearance. She is well-developed.  Eyes:     Conjunctiva/sclera: Conjunctivae normal.  Cardiovascular:     Rate and Rhythm: Normal rate and regular rhythm.     Pulses: Normal pulses.     Heart sounds: Normal heart sounds.  Pulmonary:     Effort: Pulmonary effort is normal.     Breath sounds: Normal breath sounds. No wheezing, rhonchi or rales.  Abdominal:     General: Bowel sounds are normal. There is no distension.     Palpations: Abdomen is soft. Abdomen is not rigid. There is no fluid wave or mass.     Tenderness: There is no abdominal tenderness. There is no guarding or rebound.  Musculoskeletal:     Cervical back: No swelling or bony tenderness. No pain with movement.     Thoracic back: No bony tenderness. Normal range of motion.     Lumbar back: No tenderness or bony tenderness. Normal range of motion.       Back:     Comments: Area of described pain marked on diagram.  Pain is not reproducible  Skin:    General: Skin is warm and dry.  Neurological:     Mental Status: She is alert.  Psychiatric:        Speech: Speech normal.        Behavior: Behavior normal.        Thought Content: Thought content normal.

## 2024-02-03 NOTE — Assessment & Plan Note (Signed)
 Chronic, stable.  Continue Lipitor 40 mg daily.  Pending lipid panel.  Lab Results  Component Value Date   LDLCALC 70 12/30/2022

## 2024-02-03 NOTE — Assessment & Plan Note (Signed)
 Anticipate stable.  Pending A1c.  She has done remarkably well on Ozempic  0.5 mg weekly.  Interested in increasing.  Increased Ozempic  to 1 mg weekly.  Counseled on side effects including constipation, nausea.

## 2024-02-03 NOTE — Patient Instructions (Signed)
 I have ordered CT abdomen and pelvis.   Let us  know if you dont hear back within a week in regards to an appointment being scheduled.   So that you are aware, if you are [facility] MyChart user , please pay attention to your MyChart messages as you may receive a MyChart message with a phone number to call and schedule this test/appointment own your own from our referral coordinator. This is a new process so I do not want you to miss this message.  If you are not a MyChart user, you will receive a phone call.     Please let me know how you are doing and certainly if new symptoms develop

## 2024-02-04 LAB — HEPATITIS C ANTIBODY: Hepatitis C Ab: NONREACTIVE

## 2024-02-10 ENCOUNTER — Ambulatory Visit: Admitting: Family

## 2024-02-11 ENCOUNTER — Ambulatory Visit
Admission: RE | Admit: 2024-02-11 | Discharge: 2024-02-11 | Disposition: A | Discharge status: Home / Self Care | Source: Ambulatory Visit | Attending: Family | Admitting: Family

## 2024-02-11 DIAGNOSIS — R1011 Right upper quadrant pain: Secondary | ICD-10-CM | POA: Diagnosis present

## 2024-02-11 DIAGNOSIS — E119 Type 2 diabetes mellitus without complications: Secondary | ICD-10-CM | POA: Insufficient documentation

## 2024-02-11 MED ORDER — IOHEXOL 9 MG/ML PO SOLN
500.0000 mL | ORAL | Status: AC
  Administered 2024-02-11 (×2): 500 mL via ORAL

## 2024-02-11 MED ORDER — IOHEXOL 300 MG/ML  SOLN
80.0000 mL | Freq: Once | INTRAMUSCULAR | Status: AC | PRN
  Administered 2024-02-11: 80 mL via INTRAVENOUS

## 2024-02-14 ENCOUNTER — Ambulatory Visit: Payer: Self-pay | Admitting: Family

## 2024-02-14 DIAGNOSIS — N289 Disorder of kidney and ureter, unspecified: Secondary | ICD-10-CM

## 2024-02-15 ENCOUNTER — Other Ambulatory Visit: Payer: Self-pay | Admitting: Urology

## 2024-02-15 DIAGNOSIS — N200 Calculus of kidney: Secondary | ICD-10-CM

## 2024-02-15 NOTE — H&P (View-Only) (Signed)
 02/16/2024 9:39 PM   Margaret Stark Oct 03, 1955 [id number]  Referring provider: Dineen Rollene MATSU, FNP [address] [place],  [place] [address]  Urological history: 1. None  No chief complaint on file.   HPI: Margaret Stark is a 68 y.o. woman  who presents today for kidney stone.    Previous records reviewed.   She was having flank pain for 6 months.  Contrast CT ordered by PCP discovered a 8 mm proximal left renal stone.    UA ***  KUB ***  Lab Results  Component Value Date   WBC 6.0 02/03/2024   HGB 12.8 02/03/2024   HCT 38.6 02/03/2024   MCV 85.6 02/03/2024   PLT 254.0 Repeated and verified X2. 02/03/2024    Lab Results  Component Value Date   CREATININE 1.34 (H) 02/03/2024    Lab Results  Component Value Date   HGBA1C 6.4 02/03/2024    PMH: Past Medical History:  Diagnosis Date   Arthritis    CAD (coronary artery disease)    a. LHC 11/24/2018: mild non-obstructive CAD with 30% stenosis of proximal LAD and 10% stenosis of  proximal to mid RCA   Diabetes mellitus without complication [facility])     Surgical History: Past Surgical History:  Procedure Laterality Date   COLONOSCOPY WITH PROPOFOL  N/A 07/15/2015   Procedure: COLONOSCOPY WITH PROPOFOL ;  Surgeon: Deward CINDERELLA Piedmont, MD;  Location: [facility] ENDOSCOPY;  Service: Gastroenterology;  Laterality: N/A;   LEFT HEART CATH AND CORONARY ANGIOGRAPHY N/A 11/24/2018   Procedure: LEFT HEART CATH AND CORONARY ANGIOGRAPHY;  Surgeon: Verlin Lonni BIRCH, MD;  Location: [facility] INVASIVE CV LAB;  Service: Cardiovascular;  Laterality: N/A;    Home Medications:  Allergies as of 02/16/2024   No Known Allergies      Medication List        Accurate as of February 15, 2024  9:39 PM. If you have any questions, ask your nurse or doctor.          aspirin  EC 81 MG tablet Take 81 mg by mouth daily. Swallow whole.   atorvastatin  40 MG tablet Commonly known as: LIPITOR Take 1 tablet (40 mg total) by mouth  daily.   BD Pen Needle Micro U/F 32G X 6 MM Misc Generic drug: Insulin  Pen Needle USE ONCE DAILY WITH SAXENDA    cholecalciferol 25 MCG (1000 UNIT) tablet Commonly known as: VITAMIN D3 Take 1,000 Units by mouth daily.   cyclobenzaprine  10 MG tablet Commonly known as: FLEXERIL  Take 1/2 to 1 whole tablet by mouth every 8 hours as needed for muscle spasms/pain.   Latanoprost 0.005 % Emul latanoprost 0.005 % eye drops   Semaglutide  (1 MG/DOSE) 4 MG/3ML Sopn Inject 1 mg into the skin once a week.   Vitamin D  (Ergocalciferol ) 1.25 MG (50000 UNIT) Caps capsule Commonly known as: DRISDOL  ergocalciferol  (vitamin D2) 1,250 mcg (50,000 unit) capsule   Voltaren  1 % Gel Generic drug: diclofenac  Sodium Apply 2 g topically daily as needed (knee pain).   Walker Misc 1 rolling walker for home use        Allergies: No Known Allergies  Family History: Family History  Problem Relation Age of Onset   Hypertension Mother    Hypertension Father    Cancer Sister        colon - age 34   Heart disease Neg Hx     Social History: See HPI for pertinent social history  ROS: Pertinent ROS in HPI  Physical  Exam: There were no vitals taken for this visit.  Constitutional:  Well nourished. Alert and oriented, No acute distress. HEENT: [place] AT, moist mucus membranes.  Trachea midline, no masses. Cardiovascular: No clubbing, cyanosis, or edema. Respiratory: Normal respiratory effort, no increased work of breathing. GU: No CVA tenderness.  No bladder fullness or masses.  Recession of labia minora, dry, pale vulvar vaginal mucosa and loss of mucosal ridges and folds.  Normal urethral meatus, no lesions, no prolapse, no discharge.   No urethral masses, tenderness and/or tenderness. No bladder fullness, tenderness or masses. *** vagina mucosa, *** estrogen effect, no discharge, no lesions, *** pelvic support, *** cystocele and *** rectocele noted.  No cervical motion tenderness.  Uterus is freely  mobile and non-fixed.  No adnexal/parametria masses or tenderness noted.  Anus and perineum are without rashes or lesions.   ***  Neurologic: Grossly intact, no focal deficits, moving all 4 extremities. Psychiatric: Normal mood and affect.    Laboratory Data: See EPIC and HPI  I have reviewed the labs.   Pertinent Imaging: CLINICAL DATA:  Abdominal and flank pain.   EXAM: CT ABDOMEN AND PELVIS WITH CONTRAST   TECHNIQUE: Multidetector CT imaging of the abdomen and pelvis was performed using the standard protocol following bolus administration of intravenous contrast.   RADIATION DOSE REDUCTION: This exam was performed according to the departmental dose-optimization program which includes automated exposure control, adjustment of the mA and/or kV according to patient size and/or use of iterative reconstruction technique.   CONTRAST:  80mL OMNIPAQUE  IOHEXOL  300 MG/ML  SOLN   COMPARISON:  None Available.   FINDINGS: Lower Chest: No acute findings.   Hepatobiliary: No suspicious hepatic masses identified. Gallbladder is unremarkable. No evidence of biliary ductal dilatation.   Pancreas:  No mass or inflammatory changes.   Spleen: Within normal limits in size and appearance.   Adrenals/Urinary Tract: No suspicious masses identified. Delayed contrast excretion by the left kidney is seen due to moderate left hydronephrosis. A 8 mm calculus is seen in the proximal left ureter. Unremarkable unopacified urinary bladder.   Stomach/Bowel: No evidence of obstruction, inflammatory process or abnormal fluid collections. Normal appendix visualized.   Vascular/Lymphatic: No pathologically enlarged lymph nodes. No acute vascular findings.   Reproductive: Left posterior uterine fibroid measuring approximately 4 cm. Adnexal regions are unremarkable.   Other:  None.   Musculoskeletal:  No suspicious bone lesions identified.   IMPRESSION: Moderate left hydronephrosis due to 8 mm  proximal left ureteral calculus.   Small uterine fibroid.     Electronically Signed   By: Norleen DELENA Kil M.D.   On: 02/15/2024 16:36 I have independently reviewed the films.    Assessment & Plan:  ***  1. Left ureteral stone We discussed various treatment options for urolithiasis including observation with or without medical expulsive therapy, shockwave lithotripsy (SWL), ureteroscopy and laser lithotripsy with stent placement.   We discussed that management is based on stone size, location, density, patient co-morbidities, and patient preference.    Stones <94mm in size have a >80% spontaneous passage rate. Data surrounding the use of tamsulosin for medical expulsive therapy is controversial, but meta analyses suggests it is most efficacious for distal stones between 5-54mm in size. Possible side effects include dizziness/lightheadedness   SWL has a lower stone free rate in a single procedure, but also a lower complication rate compared to ureteroscopy and avoids a stent and associated stent related symptoms. Possible complications include renal hematoma, steinstrasse, and need for additional treatment.  Ureteroscopy with laser lithotripsy and stent placement has a higher stone free rate than SWL in a single procedure, however increased complication rate including possible infection, ureteral injury, bleeding, and stent related morbidity. Common stent related symptoms include dysuria, urgency/frequency, and flank pain.       No follow-ups on file.  These notes generated with voice recognition software. I apologize for typographical errors.  CLOTILDA HELON RIGGERS  [facility] [address]  [address] [place], [place] [address] [phone]

## 2024-02-15 NOTE — Progress Notes (Unsigned)
 02/16/2024 9:39 PM   Margaret Stark Oct 03, 1955 [id number]  Referring provider: Dineen Rollene MATSU, FNP [address] [place],  [place] [address]  Urological history: 1. None  No chief complaint on file.   HPI: Margaret Stark is a 68 y.o. woman  who presents today for kidney stone.    Previous records reviewed.   She was having flank pain for 6 months.  Contrast CT ordered by PCP discovered a 8 mm proximal left renal stone.    UA ***  KUB ***  Lab Results  Component Value Date   WBC 6.0 02/03/2024   HGB 12.8 02/03/2024   HCT 38.6 02/03/2024   MCV 85.6 02/03/2024   PLT 254.0 Repeated and verified X2. 02/03/2024    Lab Results  Component Value Date   CREATININE 1.34 (H) 02/03/2024    Lab Results  Component Value Date   HGBA1C 6.4 02/03/2024    PMH: Past Medical History:  Diagnosis Date   Arthritis    CAD (coronary artery disease)    a. LHC 11/24/2018: mild non-obstructive CAD with 30% stenosis of proximal LAD and 10% stenosis of  proximal to mid RCA   Diabetes mellitus without complication [facility])     Surgical History: Past Surgical History:  Procedure Laterality Date   COLONOSCOPY WITH PROPOFOL  N/A 07/15/2015   Procedure: COLONOSCOPY WITH PROPOFOL ;  Surgeon: Deward CINDERELLA Piedmont, MD;  Location: [facility] ENDOSCOPY;  Service: Gastroenterology;  Laterality: N/A;   LEFT HEART CATH AND CORONARY ANGIOGRAPHY N/A 11/24/2018   Procedure: LEFT HEART CATH AND CORONARY ANGIOGRAPHY;  Surgeon: Verlin Lonni BIRCH, MD;  Location: [facility] INVASIVE CV LAB;  Service: Cardiovascular;  Laterality: N/A;    Home Medications:  Allergies as of 02/16/2024   No Known Allergies      Medication List        Accurate as of February 15, 2024  9:39 PM. If you have any questions, ask your nurse or doctor.          aspirin  EC 81 MG tablet Take 81 mg by mouth daily. Swallow whole.   atorvastatin  40 MG tablet Commonly known as: LIPITOR Take 1 tablet (40 mg total) by mouth  daily.   BD Pen Needle Micro U/F 32G X 6 MM Misc Generic drug: Insulin  Pen Needle USE ONCE DAILY WITH SAXENDA    cholecalciferol 25 MCG (1000 UNIT) tablet Commonly known as: VITAMIN D3 Take 1,000 Units by mouth daily.   cyclobenzaprine  10 MG tablet Commonly known as: FLEXERIL  Take 1/2 to 1 whole tablet by mouth every 8 hours as needed for muscle spasms/pain.   Latanoprost 0.005 % Emul latanoprost 0.005 % eye drops   Semaglutide  (1 MG/DOSE) 4 MG/3ML Sopn Inject 1 mg into the skin once a week.   Vitamin D  (Ergocalciferol ) 1.25 MG (50000 UNIT) Caps capsule Commonly known as: DRISDOL  ergocalciferol  (vitamin D2) 1,250 mcg (50,000 unit) capsule   Voltaren  1 % Gel Generic drug: diclofenac  Sodium Apply 2 g topically daily as needed (knee pain).   Walker Misc 1 rolling walker for home use        Allergies: No Known Allergies  Family History: Family History  Problem Relation Age of Onset   Hypertension Mother    Hypertension Father    Cancer Sister        colon - age 34   Heart disease Neg Hx     Social History: See HPI for pertinent social history  ROS: Pertinent ROS in HPI  Physical  Exam: There were no vitals taken for this visit.  Constitutional:  Well nourished. Alert and oriented, No acute distress. HEENT: [place] AT, moist mucus membranes.  Trachea midline, no masses. Cardiovascular: No clubbing, cyanosis, or edema. Respiratory: Normal respiratory effort, no increased work of breathing. GU: No CVA tenderness.  No bladder fullness or masses.  Recession of labia minora, dry, pale vulvar vaginal mucosa and loss of mucosal ridges and folds.  Normal urethral meatus, no lesions, no prolapse, no discharge.   No urethral masses, tenderness and/or tenderness. No bladder fullness, tenderness or masses. *** vagina mucosa, *** estrogen effect, no discharge, no lesions, *** pelvic support, *** cystocele and *** rectocele noted.  No cervical motion tenderness.  Uterus is freely  mobile and non-fixed.  No adnexal/parametria masses or tenderness noted.  Anus and perineum are without rashes or lesions.   ***  Neurologic: Grossly intact, no focal deficits, moving all 4 extremities. Psychiatric: Normal mood and affect.    Laboratory Data: See EPIC and HPI  I have reviewed the labs.   Pertinent Imaging: CLINICAL DATA:  Abdominal and flank pain.   EXAM: CT ABDOMEN AND PELVIS WITH CONTRAST   TECHNIQUE: Multidetector CT imaging of the abdomen and pelvis was performed using the standard protocol following bolus administration of intravenous contrast.   RADIATION DOSE REDUCTION: This exam was performed according to the departmental dose-optimization program which includes automated exposure control, adjustment of the mA and/or kV according to patient size and/or use of iterative reconstruction technique.   CONTRAST:  80mL OMNIPAQUE  IOHEXOL  300 MG/ML  SOLN   COMPARISON:  None Available.   FINDINGS: Lower Chest: No acute findings.   Hepatobiliary: No suspicious hepatic masses identified. Gallbladder is unremarkable. No evidence of biliary ductal dilatation.   Pancreas:  No mass or inflammatory changes.   Spleen: Within normal limits in size and appearance.   Adrenals/Urinary Tract: No suspicious masses identified. Delayed contrast excretion by the left kidney is seen due to moderate left hydronephrosis. A 8 mm calculus is seen in the proximal left ureter. Unremarkable unopacified urinary bladder.   Stomach/Bowel: No evidence of obstruction, inflammatory process or abnormal fluid collections. Normal appendix visualized.   Vascular/Lymphatic: No pathologically enlarged lymph nodes. No acute vascular findings.   Reproductive: Left posterior uterine fibroid measuring approximately 4 cm. Adnexal regions are unremarkable.   Other:  None.   Musculoskeletal:  No suspicious bone lesions identified.   IMPRESSION: Moderate left hydronephrosis due to 8 mm  proximal left ureteral calculus.   Small uterine fibroid.     Electronically Signed   By: Norleen DELENA Kil M.D.   On: 02/15/2024 16:36 I have independently reviewed the films.    Assessment & Plan:  ***  1. Left ureteral stone We discussed various treatment options for urolithiasis including observation with or without medical expulsive therapy, shockwave lithotripsy (SWL), ureteroscopy and laser lithotripsy with stent placement.   We discussed that management is based on stone size, location, density, patient co-morbidities, and patient preference.    Stones <94mm in size have a >80% spontaneous passage rate. Data surrounding the use of tamsulosin for medical expulsive therapy is controversial, but meta analyses suggests it is most efficacious for distal stones between 5-54mm in size. Possible side effects include dizziness/lightheadedness   SWL has a lower stone free rate in a single procedure, but also a lower complication rate compared to ureteroscopy and avoids a stent and associated stent related symptoms. Possible complications include renal hematoma, steinstrasse, and need for additional treatment.  Ureteroscopy with laser lithotripsy and stent placement has a higher stone free rate than SWL in a single procedure, however increased complication rate including possible infection, ureteral injury, bleeding, and stent related morbidity. Common stent related symptoms include dysuria, urgency/frequency, and flank pain.       No follow-ups on file.  These notes generated with voice recognition software. I apologize for typographical errors.  CLOTILDA HELON RIGGERS  [facility] [address]  [address] [place], [place] [address] [phone]

## 2024-02-16 ENCOUNTER — Ambulatory Visit: Admitting: Urology

## 2024-02-16 ENCOUNTER — Other Ambulatory Visit: Payer: Self-pay

## 2024-02-16 ENCOUNTER — Encounter: Payer: Self-pay | Admitting: Urology

## 2024-02-16 ENCOUNTER — Ambulatory Visit
Admission: RE | Admit: 2024-02-16 | Discharge: 2024-02-16 | Disposition: A | Discharge status: Home / Self Care | Attending: Urology | Admitting: Urology

## 2024-02-16 ENCOUNTER — Ambulatory Visit
Admission: RE | Admit: 2024-02-16 | Discharge: 2024-02-16 | Disposition: A | Discharge status: Home / Self Care | Source: Ambulatory Visit | Attending: Urology | Admitting: Urology

## 2024-02-16 ENCOUNTER — Telehealth: Payer: Self-pay

## 2024-02-16 VITALS — BP 123/83 | HR 76 | Ht 64.0 in | Wt 224.0 lb

## 2024-02-16 DIAGNOSIS — N201 Calculus of ureter: Secondary | ICD-10-CM

## 2024-02-16 DIAGNOSIS — N200 Calculus of kidney: Secondary | ICD-10-CM | POA: Insufficient documentation

## 2024-02-16 LAB — MICROSCOPIC EXAMINATION

## 2024-02-16 LAB — URINALYSIS, COMPLETE
Bilirubin, UA: NEGATIVE
Glucose, UA: NEGATIVE
Ketones, UA: NEGATIVE
Nitrite, UA: NEGATIVE
Protein,UA: NEGATIVE
Specific Gravity, UA: <1.005 — ABNORMAL LOW (ref 1.005–1.030)
Urobilinogen, Ur: 0.2 mg/dL (ref 0.2–1.0)
pH, UA: 6 (ref 5.0–7.5)

## 2024-02-16 NOTE — Telephone Encounter (Signed)
 Per Dr. Twylla, Patient is to be scheduled for  Left Ureteroscopy with Laser Lithotripsy and Stent Placement   Mrs. Maiorana was contacted and possible surgical dates were discussed, Tuesday July 29th, 2025 was agreed upon for surgery.   Patient was directed to call [phone] between 1-3pm the day before surgery to find out surgical arrival time.  Instructions were given not to eat or drink from midnight on the night before surgery and have a driver for the day of surgery. On the surgery day patient was instructed to enter through the Medical Mall entrance of [facility] report the Same Day Surgery desk.   Pre-Admit Testing will be in contact via phone to set up an interview with the anesthesia team to review your history and medications prior to surgery.   Reminder of this information was sent via MyChart to the patient.

## 2024-02-16 NOTE — Progress Notes (Addendum)
   [facility] Surgical Posting Form  Surgery Date: Date: 02/22/2024  Surgeon: Dr. Glendia Barba, MD  Inpt ( No  )   Outpt (Yes)   Obs ( No  )   Diagnosis: N20.1 Left Ureteral Stone  -CPT: [id number]  Surgery: Left Ureteroscopy with Laser Lithotripsy and Stent Placement  Stop Anticoagulations: Yes, will need to hold 81mg  ASA for 5 days  Patient is aware to hold Ozempic  for 1 week prior to surgery- normally takes on Thursday  Cardiac/Medical/Pulmonary Clearance needed: no  *Orders entered into EPIC  Date: 02/16/24   *Case booked in EPIC  Date: 02/16/24  *Notified pt of Surgery: Date: 02/16/24  PRE-OP UA & CX: No  *Placed into Prior Authorization Work [place] Date: 02/16/24  Assistant/laser/rep:No

## 2024-02-16 NOTE — Progress Notes (Signed)
 Surgical Physician Order Form [facility]  * Scheduling expectation :  Dr. Twylla on 07/29   *Length of Case:   *Clearance needed: no  *Anticoagulation Instructions: N/A  *Aspirin  Instructions: Hold Aspirin   *Post-op visit Date/Instructions:   TBD  *Diagnosis: Left Ureteral Stone  *Procedure: left Ureteroscopy w/laser lithotripsy & stent placement (47643)   Additional orders: N/A  -Admit type: OUTpatient  -Anesthesia: General  -VTE Prophylaxis Standing Order SCD's       Other:   -Standing Lab Orders Per Anesthesia    Lab other: None  -Standing Test orders EKG/Chest x-ray per Anesthesia       Test other:   - Medications:  Ancef 2gm IV  -Other orders:  N/A

## 2024-02-19 LAB — CULTURE, URINE COMPREHENSIVE

## 2024-02-21 ENCOUNTER — Encounter
Admission: RE | Admit: 2024-02-21 | Discharge: 2024-02-21 | Disposition: A | Discharge status: Home / Self Care | Source: Ambulatory Visit | Attending: Urology | Admitting: Urology

## 2024-02-21 ENCOUNTER — Other Ambulatory Visit: Payer: Self-pay

## 2024-02-21 VITALS — Ht 64.0 in | Wt 254.0 lb

## 2024-02-21 DIAGNOSIS — R030 Elevated blood-pressure reading, without diagnosis of hypertension: Secondary | ICD-10-CM

## 2024-02-21 DIAGNOSIS — Z01818 Encounter for other preprocedural examination: Secondary | ICD-10-CM | POA: Diagnosis present

## 2024-02-21 DIAGNOSIS — E119 Type 2 diabetes mellitus without complications: Secondary | ICD-10-CM

## 2024-02-21 DIAGNOSIS — Z01810 Encounter for preprocedural cardiovascular examination: Secondary | ICD-10-CM | POA: Insufficient documentation

## 2024-02-21 HISTORY — DX: Intervertebral disc disorders with radiculopathy, lumbar region: M51.16

## 2024-02-21 HISTORY — DX: Ocular hypertension, bilateral: H40.053

## 2024-02-21 HISTORY — DX: Right upper quadrant pain: R10.11

## 2024-02-21 HISTORY — DX: Hyperlipidemia, unspecified: E78.5

## 2024-02-21 HISTORY — DX: Hypertensive retinopathy, bilateral: H35.033

## 2024-02-21 HISTORY — DX: Deficiency of other specified B group vitamins: E53.8

## 2024-02-21 NOTE — Patient Instructions (Addendum)
 Your procedure is scheduled on: Tuesday 02/22/24 Report to the Registration Desk on the 1st floor of the Medical Mall. To find out your arrival time, please call [phone] between 1PM - 3PM on: Monday 02/21/24  If your arrival time is 6:00 am, do not arrive before that time as the Medical Mall entrance doors do not open until 6:00 am.  REMEMBER: Instructions that are not followed completely may result in serious medical risk, up to and including death; or upon the discretion of your surgeon and anesthesiologist your surgery may need to be rescheduled.  Do not eat food after midnight the night before surgery.  No gum chewing or hard candies.   One week prior to surgery: Stop Anti-inflammatories (NSAIDS) such as Advil, Aleve, Ibuprofen, Motrin, Naproxen, Naprosyn and Aspirin  based products such as Excedrin, Goody's Powder, BC Powder. Stop ANY OVER THE COUNTER supplements until after surgery.  You may however, continue to take Tylenol  if needed for pain up until the day of surgery.  Stop aspirin  EC 81 MG 5 days prior to surgery. Stop Semaglutide , 1 MG/DOSE, 4 MG/3ML SOPN 1 week prior to surgery.   Continue taking all of your other prescription medications up until the day of surgery.  ON THE DAY OF SURGERY ONLY TAKE THESE MEDICATIONS WITH SIPS OF WATER:  None    No Alcohol for 24 hours before or after surgery.  No Smoking including e-cigarettes for 24 hours before surgery.  No chewable tobacco products for at least 6 hours before surgery.  No nicotine patches on the day of surgery.  Do not use any recreational drugs for at least a week (preferably 2 weeks) before your surgery.  Please be advised that the combination of cocaine and anesthesia may have negative outcomes, up to and including death. If you test positive for cocaine, your surgery will be cancelled.  On the morning of surgery brush your teeth with toothpaste and water, you may rinse your mouth with mouthwash if you  wish. Do not swallow any toothpaste or mouthwash.  Take a fresh shower the morning of your procedure.   Do not wear jewelry, make-up, hairpins, clips or nail polish.  For welded (permanent) jewelry: bracelets, anklets, waist bands, etc.  Please have this removed prior to surgery.  If it is not removed, there is a chance that hospital personnel will need to cut it off on the day of surgery.  Do not wear lotions, powders, or perfumes.   Do not shave body hair from the neck down 48 hours before surgery.  Contact lenses, hearing aids and dentures may not be worn into surgery.  Do not bring valuables to the hospital. [facility] is not responsible for any missing/lost belongings or valuables.   Notify your doctor if there is any change in your medical condition (cold, fever, infection).  Wear comfortable clothing (specific to your surgery type) to the hospital.  After surgery, you can help prevent lung complications by doing breathing exercises.  Take deep breaths and cough every 1-2 hours. Your doctor may order a device called an Incentive Spirometer to help you take deep breaths. When coughing or sneezing, hold a pillow firmly against your incision with both hands. This is called "splinting." Doing this helps protect your incision. It also decreases belly discomfort.  If you are being admitted to the hospital overnight, leave your suitcase in the car. After surgery it may be brought to your room.  In case of increased patient census, it may be  necessary for you, the patient, to continue your postoperative care in the Same Day Surgery department.  If you are being discharged the day of surgery, you will not be allowed to drive home. You will need a responsible individual to drive you home and stay with you for 24 hours after surgery.   If you are taking public transportation, you will need to have a responsible individual with you.  Please call the Pre-admissions Testing Dept. at [phone] if you have any questions about these instructions.  Surgery Visitation Policy:  Patients having surgery or a procedure may have two visitors.  Children under the age of 82 must have an adult with them who is not the patient.  Inpatient Visitation:    Visiting hours are 7 a.m. to 8 p.m. Up to four visitors are allowed at one time in a patient room. The visitors may rotate out with other people during the day.  One visitor age 76 or older may stay with the patient overnight and must be in the room by 8 p.m.   Merchandiser, retail to address health-related social needs:  [email or web address]

## 2024-02-22 ENCOUNTER — Ambulatory Visit: Admitting: Anesthesiology

## 2024-02-22 ENCOUNTER — Ambulatory Visit

## 2024-02-22 ENCOUNTER — Ambulatory Visit
Admission: RE | Admit: 2024-02-22 | Discharge: 2024-02-22 | Disposition: A | Discharge status: Home / Self Care | Attending: Urology | Admitting: Urology

## 2024-02-22 ENCOUNTER — Encounter
Admission: RE | Disposition: A | Discharge status: Home / Self Care | Payer: Self-pay | Source: Home / Self Care | Attending: Urology

## 2024-02-22 ENCOUNTER — Encounter: Payer: Self-pay | Admitting: Urology

## 2024-02-22 DIAGNOSIS — Z7985 Long-term (current) use of injectable non-insulin antidiabetic drugs: Secondary | ICD-10-CM | POA: Insufficient documentation

## 2024-02-22 DIAGNOSIS — E66813 Obesity, class 3: Secondary | ICD-10-CM | POA: Diagnosis not present

## 2024-02-22 DIAGNOSIS — I2510 Atherosclerotic heart disease of native coronary artery without angina pectoris: Secondary | ICD-10-CM | POA: Diagnosis not present

## 2024-02-22 DIAGNOSIS — N201 Calculus of ureter: Secondary | ICD-10-CM | POA: Diagnosis not present

## 2024-02-22 DIAGNOSIS — Z8249 Family history of ischemic heart disease and other diseases of the circulatory system: Secondary | ICD-10-CM | POA: Insufficient documentation

## 2024-02-22 DIAGNOSIS — E119 Type 2 diabetes mellitus without complications: Secondary | ICD-10-CM | POA: Diagnosis not present

## 2024-02-22 DIAGNOSIS — D259 Leiomyoma of uterus, unspecified: Secondary | ICD-10-CM | POA: Insufficient documentation

## 2024-02-22 DIAGNOSIS — M1990 Unspecified osteoarthritis, unspecified site: Secondary | ICD-10-CM | POA: Diagnosis not present

## 2024-02-22 DIAGNOSIS — N132 Hydronephrosis with renal and ureteral calculous obstruction: Secondary | ICD-10-CM | POA: Insufficient documentation

## 2024-02-22 DIAGNOSIS — Z6838 Body mass index (BMI) 38.0-38.9, adult: Secondary | ICD-10-CM | POA: Insufficient documentation

## 2024-02-22 HISTORY — PX: CYSTOSCOPY/URETEROSCOPY/HOLMIUM LASER/STENT PLACEMENT: SHX6546

## 2024-02-22 LAB — GLUCOSE, CAPILLARY
Glucose-Capillary: 93 mg/dL (ref 70–99)
Glucose-Capillary: 104 mg/dL — ABNORMAL HIGH (ref 70–99)

## 2024-02-22 SURGERY — CYSTOSCOPY/URETEROSCOPY/HOLMIUM LASER/STENT PLACEMENT
Anesthesia: General | Laterality: Left

## 2024-02-22 MED ORDER — OXYCODONE HCL 5 MG PO TABS: 5.0000 mg | ORAL_TABLET | Freq: Once | ORAL | Status: DC | PRN

## 2024-02-22 MED ORDER — OXYCODONE HCL 5 MG/5ML PO SOLN: 5.0000 mg | Freq: Once | ORAL | Status: DC | PRN

## 2024-02-22 MED ORDER — CHLORHEXIDINE GLUCONATE 0.12 % MT SOLN
15.0000 mL | Freq: Once | OROMUCOSAL | Status: AC
  Administered 2024-02-22: 15 mL via OROMUCOSAL

## 2024-02-22 MED ORDER — IOHEXOL 180 MG/ML  SOLN
INTRAMUSCULAR | Status: DC | PRN
  Administered 2024-02-22 (×2): 10 mL

## 2024-02-22 MED ORDER — DROPERIDOL 2.5 MG/ML IJ SOLN: 0.6250 mg | Freq: Once | INTRAMUSCULAR | Status: DC | PRN

## 2024-02-22 MED ORDER — CHLORHEXIDINE GLUCONATE 0.12 % MT SOLN
OROMUCOSAL | Status: AC
  Filled 2024-02-22: qty 15

## 2024-02-22 MED ORDER — DEXAMETHASONE SODIUM PHOSPHATE 10 MG/ML IJ SOLN
INTRAMUSCULAR | Status: DC | PRN
  Administered 2024-02-22: 10 mg via INTRAVENOUS

## 2024-02-22 MED ORDER — MIDAZOLAM HCL 2 MG/2ML IJ SOLN
INTRAMUSCULAR | Status: AC
  Filled 2024-02-22: qty 2

## 2024-02-22 MED ORDER — CEFAZOLIN SODIUM-DEXTROSE 2-4 GM/100ML-% IV SOLN
INTRAVENOUS | Status: AC
  Filled 2024-02-22: qty 100

## 2024-02-22 MED ORDER — ACETAMINOPHEN 500 MG PO TABS
ORAL_TABLET | ORAL | Status: AC
Start: 2024-02-22 — End: 2024-02-22
  Filled 2024-02-22: qty 2

## 2024-02-22 MED ORDER — HYDROCODONE-ACETAMINOPHEN 5-325 MG PO TABS
1.0000 | ORAL_TABLET | Freq: Four times a day (QID) | ORAL | 0 refills | Status: DC | PRN
Start: 2024-02-22 — End: 2024-04-25

## 2024-02-22 MED ORDER — PROPOFOL 10 MG/ML IV BOLUS
INTRAVENOUS | Status: DC | PRN
Start: 2024-02-22 — End: 2024-02-22
  Administered 2024-02-22: 150 mg via INTRAVENOUS

## 2024-02-22 MED ORDER — ACETAMINOPHEN 500 MG PO TABS
1000.0000 mg | ORAL_TABLET | Freq: Once | ORAL | Status: AC
  Administered 2024-02-22: 1000 mg via ORAL

## 2024-02-22 MED ORDER — LIDOCAINE HCL (CARDIAC) PF 100 MG/5ML IV SOSY
PREFILLED_SYRINGE | INTRAVENOUS | Status: DC | PRN
  Administered 2024-02-22: 100 mg via INTRAVENOUS

## 2024-02-22 MED ORDER — SODIUM CHLORIDE 0.9 % IV SOLN: INTRAVENOUS | Status: DC

## 2024-02-22 MED ORDER — CEFAZOLIN SODIUM-DEXTROSE 2-4 GM/100ML-% IV SOLN
2.0000 g | INTRAVENOUS | Status: AC
  Administered 2024-02-22: 2 g via INTRAVENOUS

## 2024-02-22 MED ORDER — MIDAZOLAM HCL 2 MG/2ML IJ SOLN
INTRAMUSCULAR | Status: DC | PRN
  Administered 2024-02-22: 2 mg via INTRAVENOUS

## 2024-02-22 MED ORDER — SODIUM CHLORIDE 0.9 % IR SOLN
Status: DC | PRN
  Administered 2024-02-22: 1

## 2024-02-22 MED ORDER — TROSPIUM CHLORIDE 20 MG PO TABS: 20.0000 mg | ORAL_TABLET | Freq: Two times a day (BID) | ORAL | 0 refills | Status: DC | PRN

## 2024-02-22 MED ORDER — FENTANYL CITRATE (PF) 100 MCG/2ML IJ SOLN: 25.0000 ug | INTRAMUSCULAR | Status: DC | PRN

## 2024-02-22 MED ORDER — ACETAMINOPHEN 10 MG/ML IV SOLN: 1000.0000 mg | Freq: Once | INTRAVENOUS | Status: DC | PRN

## 2024-02-22 MED ORDER — TAMSULOSIN HCL 0.4 MG PO CAPS: 0.4000 mg | ORAL_CAPSULE | Freq: Every day | ORAL | 0 refills | Status: DC

## 2024-02-22 MED ORDER — ORAL CARE MOUTH RINSE: 15.0000 mL | Freq: Once | OROMUCOSAL | Status: AC

## 2024-02-22 MED ORDER — FENTANYL CITRATE (PF) 100 MCG/2ML IJ SOLN
INTRAMUSCULAR | Status: AC
  Filled 2024-02-22: qty 2

## 2024-02-22 MED ORDER — FENTANYL CITRATE (PF) 100 MCG/2ML IJ SOLN
INTRAMUSCULAR | Status: DC | PRN
  Administered 2024-02-22 (×2): 50 ug via INTRAVENOUS

## 2024-02-22 MED ORDER — ONDANSETRON HCL 4 MG/2ML IJ SOLN
INTRAMUSCULAR | Status: DC | PRN
  Administered 2024-02-22: 4 mg via INTRAVENOUS

## 2024-02-22 MED ORDER — ROCURONIUM BROMIDE 100 MG/10ML IV SOLN
INTRAVENOUS | Status: DC | PRN
  Administered 2024-02-22: 50 mg via INTRAVENOUS

## 2024-02-22 MED ORDER — SUGAMMADEX SODIUM 200 MG/2ML IV SOLN
INTRAVENOUS | Status: DC | PRN
  Administered 2024-02-22: 200 mg via INTRAVENOUS

## 2024-02-22 SURGICAL SUPPLY — 22 items
BAG DRAIN SIEMENS DORNER NS (MISCELLANEOUS) ×1 IMPLANT
BASKET ZERO TIP 1.9FR (BASKET) IMPLANT
BRUSH SCRUB EZ 4% CHG (MISCELLANEOUS) ×1 IMPLANT
CATH URET FLEX-TIP 2 LUMEN 10F (CATHETERS) IMPLANT
CATH URETL OPEN END 6X70 (CATHETERS) IMPLANT
DRAPE UTILITY 15X26 TOWEL STRL (DRAPES) ×1 IMPLANT
FIBER LASER MOSES 365 DFL (Laser) IMPLANT
GLOVE BIOGEL PI IND STRL 7.5 (GLOVE) ×1 IMPLANT
GOWN STRL REUS W/ TWL LRG LVL3 (GOWN DISPOSABLE) ×1 IMPLANT
GOWN STRL REUS W/ TWL XL LVL3 (GOWN DISPOSABLE) ×1 IMPLANT
GUIDEWIRE STR DUAL SENSOR (WIRE) ×1 IMPLANT
GUIDEWIRE STR ZIPWIRE 035X150 (MISCELLANEOUS) IMPLANT
KIT TURNOVER CYSTO (KITS) ×1 IMPLANT
PACK CYSTO AR (MISCELLANEOUS) ×1 IMPLANT
SET CYSTO W/LG BORE CLAMP LF (SET/KITS/TRAYS/PACK) ×1 IMPLANT
SHEATH NAVIGATOR HD 12/14X36 (SHEATH) IMPLANT
SOL .9 NS 3000ML IRR UROMATIC (IV SOLUTION) ×1 IMPLANT
STENT URET 6FRX24 CONTOUR (STENTS) IMPLANT
SURGILUBE 2OZ TUBE FLIPTOP (MISCELLANEOUS) ×1 IMPLANT
SYRINGE TOOMEY IRRIG 70ML (MISCELLANEOUS) IMPLANT
VALVE UROSEAL ADJ ENDO (VALVE) IMPLANT
WATER STERILE IRR 500ML POUR (IV SOLUTION) ×1 IMPLANT

## 2024-02-22 NOTE — Anesthesia Preprocedure Evaluation (Addendum)
 Anesthesia Evaluation  Patient identified by MRN, date of birth, ID band Patient awake    Reviewed: Allergy & Precautions, H&P , NPO status , Patient's Chart, lab work & pertinent test results  Airway Mallampati: II  TM Distance: >3 FB Neck ROM: full    Dental no notable dental hx.    Pulmonary neg pulmonary ROS   Pulmonary exam normal        Cardiovascular Exercise Tolerance: Good (-) angina + CAD  Normal cardiovascular exam     Neuro/Psych negative neurological ROS  negative psych ROS   GI/Hepatic negative GI ROS, Neg liver ROS,,,  Endo/Other  diabetes, Type 2  Class 3 obesity  Renal/GU      Musculoskeletal  (+) Arthritis ,    Abdominal  (+) + obese  Peds  Hematology negative hematology ROS (+)   Anesthesia Other Findings Past Medical History: No date: Arthritis No date: B12 deficiency No date: CAD (coronary artery disease)     Comment:  a. LHC 11/24/2018: mild non-obstructive CAD with 30%               stenosis of proximal LAD and 10% stenosis of  proximal to              mid RCA No date: Diabetes mellitus without complication (HCC) No date: Hyperlipidemia, unspecified hyperlipidemia type No date: Hypertensive retinopathy, bilateral No date: Intervertebral disc disorders with radiculopathy, lumbar  region No date: Ocular hypertension, bilateral No date: Right upper quadrant abdominal pain  Past Surgical History: 07/15/2015: COLONOSCOPY WITH PROPOFOL ; N/A     Comment:  Procedure: COLONOSCOPY WITH PROPOFOL ;  Surgeon: Deward CINDERELLA Piedmont, MD;  Location: [facility] ENDOSCOPY;  Service:               Gastroenterology;  Laterality: N/A; 11/24/2018: LEFT HEART CATH AND CORONARY ANGIOGRAPHY; N/A     Comment:  Procedure: LEFT HEART CATH AND CORONARY ANGIOGRAPHY;                Surgeon: Verlin Lonni BIRCH, MD;  Location: [facility]               INVASIVE CV LAB;  Service: Cardiovascular;  Laterality:                N/A;     Reproductive/Obstetrics negative OB ROS                              Anesthesia Physical Anesthesia Plan  ASA: 2  Anesthesia Plan: General   Post-op Pain Management: Tylenol  PO (pre-op)*   Induction: Intravenous  PONV Risk Score and Plan: 2 and Ondansetron , Dexamethasone  and Midazolam   Airway Management Planned: Oral ETT and LMA  Additional Equipment:   Intra-op Plan:   Post-operative Plan: Extubation in OR  Informed Consent: I have reviewed the patients History and Physical, chart, labs and discussed the procedure including the risks, benefits and alternatives for the proposed anesthesia with the patient or authorized representative who has indicated his/her understanding and acceptance.     Dental Advisory Given  Plan Discussed with: CRNA and Surgeon  Anesthesia Plan Comments:          Anesthesia Quick Evaluation

## 2024-02-22 NOTE — Transfer of Care (Signed)
 Immediate Anesthesia Transfer of Care Note  Patient: Margaret Stark  Procedure(s) Performed: CYSTOSCOPY/URETEROSCOPY/HOLMIUM LASER/STENT PLACEMENT (Left)  Patient Location: PACU  Anesthesia Type:General  Level of Consciousness: drowsy  Airway & Oxygen Therapy: Patient Spontanous Breathing and Patient connected to face mask oxygen  Post-op Assessment: Report given to RN and Post -op Vital signs reviewed and stable  Post vital signs: Reviewed and stable  Last Vitals:  Vitals Value Taken Time  BP 136/68   Temp    Pulse 76 02/22/24 10:37  Resp 11 02/22/24 10:37  SpO2 95 % 02/22/24 10:37  Vitals shown include unfiled device data.  Last Pain:  Vitals:   02/22/24 0818  PainSc: 0-No pain         Complications: No notable events documented.

## 2024-02-22 NOTE — Interval H&P Note (Signed)
 History and Physical Interval Note:  02/22/2024 9:01 AM  Margaret Stark  has presented today for surgery, with the diagnosis of Left Ureteral Stone.  The various methods of treatment have been discussed with the patient and family. After consideration of risks, benefits and other options for treatment, the patient has consented to  Procedure(s): CYSTOSCOPY/URETEROSCOPY/HOLMIUM LASER/STENT PLACEMENT (Left) as a surgical intervention.  The patient's history has been reviewed, patient examined, no change in status, stable for surgery.  I have reviewed the patient's chart and labs.  CT reviewed and stone measures 8 x 13 mm.  Discussed the need for a postoperative ureteral stent possibility of stent related pain/voiding symptoms.  Other potential risks were discussed including ureteral injury.  Questions were answered to the patient's satisfaction.    CV: RRR Lungs: Clear   Sahiba Granholm C Desera Graffeo

## 2024-02-22 NOTE — Anesthesia Postprocedure Evaluation (Signed)
 Anesthesia Post Note  Patient: Margaret Stark  Procedure(s) Performed: CYSTOSCOPY/URETEROSCOPY/HOLMIUM LASER/STENT PLACEMENT (Left)  Patient location during evaluation: PACU Anesthesia Type: General Level of consciousness: awake and alert Pain management: pain level controlled Vital Signs Assessment: post-procedure vital signs reviewed and stable Respiratory status: spontaneous breathing, nonlabored ventilation and respiratory function stable Cardiovascular status: blood pressure returned to baseline and stable Postop Assessment: no apparent nausea or vomiting Anesthetic complications: no   No notable events documented.   Last Vitals:  Vitals:   02/22/24 1100 02/22/24 1120  BP: (!) 141/68 (!) 160/75  Pulse: 76 72  Resp: 13 18  Temp:  (!) 36.2 C  SpO2: 96% 99%    Last Pain:  Vitals:   02/22/24 1120  TempSrc: Temporal  PainSc: 0-No pain                 Camellia Merilee Louder

## 2024-02-22 NOTE — Discharge Instructions (Signed)
 DISCHARGE INSTRUCTIONS FOR KIDNEY STONE/URETERAL STENT   MEDICATIONS:  1. Resume all your other meds from home.  2.  AZO (over-the-counter) can help with the burning/stinging when you urinate. 3.  Hydrocodone  is for moderate/severe pain, Rx was sent to your pharmacy. 4.  Tamsulosin  and trospium  are medications which will help with stent/bladder irritation.  Rxs were sent to your pharmacy  ACTIVITY:  1. May resume regular activities in 24 hours. 2. No driving while on narcotic pain medications  3. Drink plenty of water  4. Continue to walk at home - you can still get blood clots when you are at home, so keep active, but don't over do it.  5. May return to work/school tomorrow or when you feel ready    SIGNS/SYMPTOMS TO CALL:  Common postoperative symptoms include urinary frequency, urgency, bladder spasm and blood in the urine  Please call us  if you have a fever greater than 101.5, uncontrolled nausea/vomiting, uncontrolled pain, dizziness, unable to urinate, excessively bloody urine, chest pain, shortness of breath, leg swelling, leg pain, or any other concerns or questions.   You can reach us  at [phone].   FOLLOW-UP:  1. You will be contacted for an appointment for stent removal

## 2024-02-22 NOTE — Op Note (Signed)
 Preoperative diagnosis:  Left proximal ureteral calculus  Postoperative diagnosis:  Left proximal ureteral calculus  Procedure:  Cystoscopy Left ureteroscopy and stone removal Ureteroscopic laser lithotripsy Left ureteral stent placement (61F/24 cm) Left retrograde pyelography with interpretation  Surgeon: Glendia C. Genene Kilman, M.D.  Anesthesia: General  Complications: None  Intraoperative findings:  Cystoscopy: Bladder mucosa without solid or papillary lesions; UOs normal-appearing bilaterally. Ureteroscopy: Impacted left proximal ureteral calculus Left retrograde pyelography post procedure showed no filling defects, stone fragments or contrast extravasation  EBL: Minimal  Specimens: Calculus fragments for analysis   Indication: Margaret Stark is a 68 y.o.  female with a 34-month history of intermittent left flank pain.  CT ordered by PCP remarkable for an 8 x 15 mm left proximal ureteral calculus with moderate-severe hydronephrosis/hydroureter.  Left renal parenchymal thinning noted.  After reviewing the management options for treatment, the patient elected to proceed with the above surgical procedure(s). We have discussed the potential benefits and risks of the procedure, side effects of the proposed treatment, the likelihood of the patient achieving the goals of the procedure, and any potential problems that might occur during the procedure or recuperation. Informed consent has been obtained.  Description of procedure:  The patient was taken to the operating room and general anesthesia was induced.  The patient was placed in the dorsal lithotomy position, prepped and draped in the usual sterile fashion, and preoperative antibiotics were administered. A preoperative time-out was performed.     A 21 French cystoscope was lubricated and placed per urethra.  Panendoscopy was performed with findings as described above  Attention was directed to the left ureteral orifice and a  0.038 Sensor wire was then advanced up the ureter however could not be advanced proximal to the calculus under fluoroscopic guidance.  A 61F open ended ureteral catheter was then placed through the cystoscope and into the left ureter and advanced just distal to the calculus.  A 0.035 Zip wire was then placed through the ureteral catheter and also could not be advanced proximal to the calculus.  The sensor wire was then readvanced to the level of the stone.  The cystoscope was removed and a 4.41F semirigid ureteroscope was placed per urethra and into the left ureter without difficulty.  The ureteroscope was easily advanced proximally to the level of the stone.  The stone was then dusted with a 365 m Moses holmium laser fiber at a setting of 0.3 J/40 hz.  Once an opening was identified the laser fiber was removed and the Sensor wire was advanced through the ureteroscope, passed the calculus and into the renal pelvis under fluoroscopic guidance.  The ureteroscope was removed and then repassed to the level of the stone.  The remainder the stone was dusted with the holmium laser fiber.  A few fragments that chipped off the stone during dusting were removed with a 1.51F 0 tip nitinol basket.  The ureteroscope was then passed just distal to the UPJ and no stone fragments were identified.  Retrograde pyelogram was performed through the ureteroscope with findings as described above.  The scope was withdrawn under direct vision and no significant sized fragments were identified.  A 6 F/24 cm Contour ureteral stent without tether was placed under fluoroscopic guidance.  The wire was then removed with an adequate stent curl noted in the renal pelvis as well as in the bladder.  The bladder was then emptied and the procedure ended.  The patient appeared to tolerate the procedure well and  without complications.  After anesthetic reversal the patient was transported to the PACU in stable condition.   Plan: Recommend  indwelling stent x 14 days Office follow-up with stent removal will be scheduled   Glendia Barba, MD

## 2024-02-22 NOTE — Anesthesia Procedure Notes (Addendum)
 Procedure Name: Intubation Date/Time: 02/22/2024 9:13 AM  Performed by: Belinda, Josie Mesa, CRNAPre-anesthesia Checklist: Patient identified, Emergency Drugs available, Suction available and Patient being monitored Patient Re-evaluated:Patient Re-evaluated prior to induction Oxygen Delivery Method: Circle system utilized Preoxygenation: Pre-oxygenation with 100% oxygen Induction Type: IV induction Ventilation: Mask ventilation without difficulty Laryngoscope Size: McGrath and 3 Grade View: Grade I Tube type: Oral Tube size: 7.0 mm Number of attempts: 1 Airway Equipment and Method: Stylet Placement Confirmation: ETT inserted through vocal cords under direct vision, positive ETCO2 and breath sounds checked- equal and bilateral Secured at: 20 cm Tube secured with: Tape Dental Injury: Teeth and Oropharynx as per pre-operative assessment

## 2024-02-23 ENCOUNTER — Encounter: Payer: Self-pay | Admitting: Urology

## 2024-02-28 ENCOUNTER — Other Ambulatory Visit: Payer: Self-pay | Admitting: Urology

## 2024-03-02 LAB — STONE ANALYSIS
Calcium Oxalate Dihydrate: 10 %
Calcium Oxalate Monohydrate: 90 %
Weight Calculi: 20 mg

## 2024-03-06 ENCOUNTER — Ambulatory Visit (INDEPENDENT_AMBULATORY_CARE_PROVIDER_SITE_OTHER)

## 2024-03-06 ENCOUNTER — Other Ambulatory Visit: Payer: Self-pay

## 2024-03-06 ENCOUNTER — Ambulatory Visit: Payer: Self-pay | Admitting: Family

## 2024-03-06 DIAGNOSIS — R899 Unspecified abnormal finding in specimens from other organs, systems and tissues: Secondary | ICD-10-CM | POA: Diagnosis not present

## 2024-03-06 DIAGNOSIS — E538 Deficiency of other specified B group vitamins: Secondary | ICD-10-CM

## 2024-03-06 LAB — BASIC METABOLIC PANEL WITH GFR
BUN: 12 mg/dL (ref 6–23)
CO2: 30 meq/L (ref 19–32)
Calcium: 9.4 mg/dL (ref 8.4–10.5)
Chloride: 104 meq/L (ref 96–112)
Creatinine, Ser: 1.17 mg/dL (ref 0.40–1.20)
GFR: 48.23 mL/min — ABNORMAL LOW (ref >60.00)
Glucose, Bld: 91 mg/dL (ref 70–99)
Potassium: 4.2 meq/L (ref 3.5–5.1)
Sodium: 141 meq/L (ref 135–145)

## 2024-03-06 MED ORDER — CYANOCOBALAMIN 1000 MCG/ML IJ SOLN
1000.0000 ug | Freq: Once | INTRAMUSCULAR | Status: AC
  Administered 2024-03-06 (×2): 1000 ug via INTRAMUSCULAR

## 2024-03-06 NOTE — Progress Notes (Signed)
 Pt presented for their vitamin B12 injection. Pt was identified through two identifiers. Pt tolerated shot well in their left deltoid.

## 2024-03-10 ENCOUNTER — Other Ambulatory Visit: Payer: Self-pay | Admitting: Family

## 2024-03-10 ENCOUNTER — Encounter: Payer: Self-pay | Admitting: Family

## 2024-03-10 DIAGNOSIS — D259 Leiomyoma of uterus, unspecified: Secondary | ICD-10-CM

## 2024-03-13 NOTE — Telephone Encounter (Signed)
 Pt has been notified.

## 2024-03-14 ENCOUNTER — Inpatient Hospital Stay
Admission: RE | Admit: 2024-03-14 | Discharge: 2024-03-14 | Discharge status: Home / Self Care | Source: Ambulatory Visit | Attending: Family | Admitting: Family

## 2024-03-14 DIAGNOSIS — D259 Leiomyoma of uterus, unspecified: Secondary | ICD-10-CM

## 2024-03-15 ENCOUNTER — Encounter: Admitting: Urology

## 2024-03-15 ENCOUNTER — Ambulatory Visit: Admitting: Urology

## 2024-03-15 VITALS — BP 148/74 | HR 79 | Ht 64.0 in | Wt 250.0 lb

## 2024-03-15 DIAGNOSIS — Z466 Encounter for fitting and adjustment of urinary device: Secondary | ICD-10-CM | POA: Diagnosis not present

## 2024-03-15 MED ORDER — SULFAMETHOXAZOLE-TRIMETHOPRIM 800-160 MG PO TABS
1.0000 | ORAL_TABLET | Freq: Once | ORAL | Status: AC
  Administered 2024-03-15: 1 via ORAL

## 2024-03-15 NOTE — Patient Instructions (Signed)
                    SCHEDLING [phone]

## 2024-03-20 ENCOUNTER — Other Ambulatory Visit: Payer: Self-pay | Admitting: Urology

## 2024-03-20 NOTE — Progress Notes (Signed)
   Indications: Patient is 68 y.o., who is s/p ureteroscopic removal of an impacted left proximal ureteral calculus 02/22/2024.  The patient is presenting today for stent removal.  Procedure:  Flexible Cystoscopy with stent removal (47689)  Timeout was performed and the correct patient, procedure and participants were identified.    Description:  The patient was prepped and draped in the usual sterile fashion. Flexible cystoscopy was performed.  The stent was visualized, grasped, and removed intact without difficulty. The patient tolerated the procedure well.  A single dose of oral antibiotics was given.  Complications:  None  Plan:  Call for fever, flank pain post stent removal RUS 3-4 weeks

## 2024-03-21 ENCOUNTER — Ambulatory Visit: Admission: RE | Admit: 2024-03-21 | Source: Ambulatory Visit

## 2024-03-21 ENCOUNTER — Telehealth: Payer: Self-pay | Admitting: Urology

## 2024-03-21 ENCOUNTER — Other Ambulatory Visit: Payer: Self-pay | Admitting: *Deleted

## 2024-03-21 NOTE — Telephone Encounter (Signed)
 Per message forwarded to me by Harlene from Dr. Twylla; I called patient and advised her to call central scheduling and reschedule her renal US  for 3-4 weeks (she was currently scheduled for 03/21/24). Patient verbalized understanding, and will call to reschedule it.

## 2024-03-28 ENCOUNTER — Ambulatory Visit: Payer: Self-pay | Admitting: Family

## 2024-03-29 ENCOUNTER — Ambulatory Visit

## 2024-04-10 ENCOUNTER — Ambulatory Visit (INDEPENDENT_AMBULATORY_CARE_PROVIDER_SITE_OTHER)

## 2024-04-10 DIAGNOSIS — E538 Deficiency of other specified B group vitamins: Secondary | ICD-10-CM | POA: Diagnosis not present

## 2024-04-10 MED ORDER — CYANOCOBALAMIN 1000 MCG/ML IJ SOLN
1000.0000 ug | Freq: Once | INTRAMUSCULAR | Status: AC
  Administered 2024-04-10: 1000 ug via INTRAMUSCULAR

## 2024-04-10 NOTE — Progress Notes (Signed)
 Pt presented for their vitamin B12 injection. Pt was identified through two identifiers. Pt tolerated shot well in their right deltoid.

## 2024-04-13 ENCOUNTER — Ambulatory Visit
Admission: RE | Admit: 2024-04-13 | Discharge: 2024-04-13 | Disposition: A | Discharge status: Home / Self Care | Source: Ambulatory Visit | Attending: Urology | Admitting: Urology

## 2024-04-13 DIAGNOSIS — Z466 Encounter for fitting and adjustment of urinary device: Secondary | ICD-10-CM | POA: Insufficient documentation

## 2024-04-13 DIAGNOSIS — Z87442 Personal history of urinary calculi: Secondary | ICD-10-CM | POA: Diagnosis not present

## 2024-04-13 DIAGNOSIS — N1330 Unspecified hydronephrosis: Secondary | ICD-10-CM | POA: Diagnosis not present

## 2024-04-13 DIAGNOSIS — N139 Obstructive and reflux uropathy, unspecified: Secondary | ICD-10-CM | POA: Insufficient documentation

## 2024-04-13 DIAGNOSIS — N200 Calculus of kidney: Secondary | ICD-10-CM | POA: Diagnosis not present

## 2024-04-23 ENCOUNTER — Ambulatory Visit: Payer: Self-pay | Admitting: Urology

## 2024-04-24 ENCOUNTER — Other Ambulatory Visit: Payer: Self-pay | Admitting: *Deleted

## 2024-04-24 DIAGNOSIS — N1339 Other hydronephrosis: Secondary | ICD-10-CM

## 2024-04-25 ENCOUNTER — Encounter: Payer: Self-pay | Admitting: Nurse Practitioner

## 2024-04-25 ENCOUNTER — Ambulatory Visit: Admitting: Nurse Practitioner

## 2024-04-25 VITALS — BP 110/72 | HR 81 | Temp 98.1°F | Ht 64.0 in | Wt 226.4 lb

## 2024-04-25 DIAGNOSIS — R202 Paresthesia of skin: Secondary | ICD-10-CM | POA: Diagnosis not present

## 2024-04-25 DIAGNOSIS — E119 Type 2 diabetes mellitus without complications: Secondary | ICD-10-CM

## 2024-04-25 DIAGNOSIS — R944 Abnormal results of kidney function studies: Secondary | ICD-10-CM | POA: Diagnosis not present

## 2024-04-25 DIAGNOSIS — E785 Hyperlipidemia, unspecified: Secondary | ICD-10-CM | POA: Diagnosis not present

## 2024-04-25 DIAGNOSIS — Z7985 Long-term (current) use of injectable non-insulin antidiabetic drugs: Secondary | ICD-10-CM | POA: Diagnosis not present

## 2024-04-25 MED ORDER — SEMAGLUTIDE (2 MG/DOSE) 8 MG/3ML ~~LOC~~ SOPN: 2.0000 mg | PEN_INJECTOR | SUBCUTANEOUS | 5 refills | Status: AC

## 2024-04-25 NOTE — Assessment & Plan Note (Signed)
 She experiences an intermittent nocturnal crawling sensation in her lower extremities, without the urge to move them. Concern for RLS. Check ferritin and B12 level. Further work up pending.

## 2024-04-25 NOTE — Assessment & Plan Note (Signed)
 Her Type 2 diabetes is managed with Ozempic  1 mg, with a last A1c of 6.4 indicating good control. She requested an increased dosage for better management. Increase Ozempic  to 2 mg weekly. Monitor blood glucose levels and check A1c in six months. Encourage healthy diet and regular exercise.

## 2024-04-25 NOTE — Assessment & Plan Note (Addendum)
 Following kidney stone removal, she has mild hydronephrosis, improving since CT. A renal scan is scheduled to assess for obstruction, and ongoing renal function monitoring is necessary. Check BMP, review results of the upcoming renal scan, and follow up with urology as scheduled.

## 2024-04-25 NOTE — Assessment & Plan Note (Signed)
 Her hyperlipidemia is managed with atorvastatin , with no reported side effects. Continue atorvastatin .

## 2024-04-25 NOTE — Progress Notes (Signed)
 Leron Glance, NP-C Phone: [phone]  Margaret Stark is a 68 y.o. female who presents today for transfer of care.   Discussed the use of AI scribe software for clinical note transcription with the patient, who gave verbal consent to proceed.  History of Present Illness   Margaret Stark is a 68 year old female who presents for transfer of care with a sensation of crawling in her legs at night.  She experiences a sensation of crawling in her legs, primarily at night when lying down. The sensation occurs on the inside of her legs, from the knees down, without causing pain, tingling, or itching. This has been present for about a month and occurs mostly every night. It does not prevent her from sleeping, and she does not feel the need to move her legs excessively.  She recently had a kidney stone removed and is scheduled for a renal scan next Monday to evaluate mild hydronephrosis and ensure there is no obstruction. She did not take pain medication prescribed post-procedure as she did not experience pain. Her kidney function had been dropping prior to the procedure.  She is currently on Ozempic  1 mg for blood sugar control, with her last A1c recorded at 6.4. She wants to increase the dose to 2 mg. No excessive thirst, urination, or low blood sugar episodes. She also takes atorvastatin  for cholesterol management without any side effects such as abdominal pain or muscle aches.  Her social history includes living with her husband and occasionally staying alone when he cares for her 40 year old mother-in-law. She reports going to bed early, around 7 PM, and sometimes waking up at 2 or 3 AM, which she attributes to getting too much sleep. She takes vitamin D3 and an 81 mg aspirin  daily.      Social History   Tobacco Use  Smoking Status Never  Smokeless Tobacco Never    Current Outpatient Medications on File Prior to Visit  Medication Sig Dispense Refill   atorvastatin  (LIPITOR) 40 MG  tablet Take 1 tablet (40 mg total) by mouth daily. 90 tablet 3   BD PEN NEEDLE MICRO U/F 32G X 6 MM MISC USE ONCE DAILY WITH SAXENDA  100 each 1   diclofenac  Sodium (VOLTAREN ) 1 % GEL Apply 2 g topically daily as needed (knee pain).     latanoprost (XALATAN) 0.005 % ophthalmic solution Place 1 drop into both eyes at bedtime.     No current facility-administered medications on file prior to visit.    ROS see history of present illness  Objective  Physical Exam Vitals:   04/25/24 1526  BP: 110/72  Pulse: 81  Temp: 98.1 F (36.7 C)  SpO2: 99%    BP Readings from Last 3 Encounters:  04/25/24 110/72  03/15/24 (!) 148/74  02/22/24 (!) 160/75   Wt Readings from Last 3 Encounters:  04/25/24 226 lb 6.4 oz (102.7 kg)  03/15/24 250 lb (113.4 kg)  02/22/24 224 lb (101.6 kg)    Physical Exam Constitutional:      General: She is not in acute distress.    Appearance: Normal appearance.  HENT:     Head: Normocephalic.  Cardiovascular:     Rate and Rhythm: Normal rate and regular rhythm.     Heart sounds: Normal heart sounds.  Pulmonary:     Effort: Pulmonary effort is normal.     Breath sounds: Normal breath sounds.  Skin:    General: Skin is warm and dry.  Neurological:  General: No focal deficit present.     Mental Status: She is alert.  Psychiatric:        Mood and Affect: Mood normal.        Behavior: Behavior normal.      Assessment/Plan: Please see individual problem list.  Type 2 diabetes mellitus without complication, without long-term current use of insulin  (HCC) Assessment & Plan: Her Type 2 diabetes is managed with Ozempic  1 mg, with a last A1c of 6.4 indicating good control. She requested an increased dosage for better management. Increase Ozempic  to 2 mg weekly. Monitor blood glucose levels and check A1c in six months. Encourage healthy diet and regular exercise.   Orders: -     Semaglutide  (2 MG/DOSE); Inject 2 mg as directed once a week.  Dispense: 3  mL; Refill: 5  Sensation of skin crawling Assessment & Plan: She experiences an intermittent nocturnal crawling sensation in her lower extremities, without the urge to move them. Concern for RLS. Check ferritin and B12 level. Further work up pending.   Orders: -     Vitamin B12 -     IBC + Ferritin  Decreased GFR Assessment & Plan: Following kidney stone removal, she has mild hydronephrosis, improving since CT. A renal scan is scheduled to assess for obstruction, and ongoing renal function monitoring is necessary. Check BMP, review results of the upcoming renal scan, and follow up with urology as scheduled.   Orders: -     Basic metabolic panel with GFR  Hyperlipidemia, unspecified hyperlipidemia type Assessment & Plan: Her hyperlipidemia is managed with atorvastatin , with no reported side effects. Continue atorvastatin .       Return in about 6 months (around 10/23/2024) for Follow up.   Leron Glance, NP-C [facility]

## 2024-04-26 LAB — BASIC METABOLIC PANEL WITH GFR
BUN: 14 mg/dL (ref 6–23)
CO2: 28 meq/L (ref 19–32)
Calcium: 10.3 mg/dL (ref 8.4–10.5)
Chloride: 102 meq/L (ref 96–112)
Creatinine, Ser: 1.18 mg/dL (ref 0.40–1.20)
GFR: 47.69 mL/min — ABNORMAL LOW (ref >60.00)
Glucose, Bld: 96 mg/dL (ref 70–99)
Potassium: 3.9 meq/L (ref 3.5–5.1)
Sodium: 140 meq/L (ref 135–145)

## 2024-04-26 LAB — VITAMIN B12: Vitamin B-12: 607 pg/mL (ref 211–911)

## 2024-04-26 LAB — IBC + FERRITIN
Ferritin: 56.1 ng/mL (ref 10.0–291.0)
Iron: 61 ug/dL (ref 42–145)
Saturation Ratios: 22.6 % (ref 20.0–50.0)
TIBC: 270.2 ug/dL (ref 250.0–450.0)
Transferrin: 193 mg/dL — ABNORMAL LOW (ref 212.0–360.0)

## 2024-04-27 ENCOUNTER — Ambulatory Visit: Payer: Self-pay | Admitting: Nurse Practitioner

## 2024-05-02 ENCOUNTER — Other Ambulatory Visit: Payer: Self-pay | Admitting: Family

## 2024-05-02 DIAGNOSIS — E119 Type 2 diabetes mellitus without complications: Secondary | ICD-10-CM

## 2024-05-04 ENCOUNTER — Encounter: Payer: Self-pay | Admitting: Pharmacist

## 2024-05-04 DIAGNOSIS — H35033 Hypertensive retinopathy, bilateral: Secondary | ICD-10-CM | POA: Diagnosis not present

## 2024-05-04 DIAGNOSIS — H4711 Papilledema associated with increased intracranial pressure: Secondary | ICD-10-CM | POA: Diagnosis not present

## 2024-05-04 DIAGNOSIS — E119 Type 2 diabetes mellitus without complications: Secondary | ICD-10-CM | POA: Diagnosis not present

## 2024-05-04 DIAGNOSIS — H40053 Ocular hypertension, bilateral: Secondary | ICD-10-CM | POA: Diagnosis not present

## 2024-05-04 NOTE — Progress Notes (Signed)
 Pharmacy Quality Measure Review  This patient is appearing on a report for being at risk of failing the adherence measure for cholesterol (statin) medications this calendar year.   Medication: atorvastatin  40 mg Last fill date: 10/29/2023 for 90 day supply No refills remaining. Last refilled by [facility]. Rx Expired.   MyChart message sent to patient. Will confirm if she is still taking this medication.

## 2024-05-05 ENCOUNTER — Telehealth: Payer: Self-pay

## 2024-05-05 DIAGNOSIS — E785 Hyperlipidemia, unspecified: Secondary | ICD-10-CM

## 2024-05-05 MED ORDER — ATORVASTATIN CALCIUM 40 MG PO TABS: 40.0000 mg | ORAL_TABLET | Freq: Every day | ORAL | 3 refills | Status: AC

## 2024-05-05 NOTE — Telephone Encounter (Signed)
 Previously filled by [facility]

## 2024-05-08 ENCOUNTER — Encounter
Admission: RE | Admit: 2024-05-08 | Discharge: 2024-05-08 | Disposition: A | Discharge status: Home / Self Care | Source: Ambulatory Visit | Attending: Urology | Admitting: Urology

## 2024-05-08 DIAGNOSIS — N1330 Unspecified hydronephrosis: Secondary | ICD-10-CM | POA: Diagnosis not present

## 2024-05-08 DIAGNOSIS — N1339 Other hydronephrosis: Secondary | ICD-10-CM | POA: Insufficient documentation

## 2024-05-08 DIAGNOSIS — Z87442 Personal history of urinary calculi: Secondary | ICD-10-CM | POA: Diagnosis not present

## 2024-05-08 MED ORDER — TECHNETIUM TC 99M MERTIATIDE
5.3900 | Freq: Once | INTRAVENOUS | Status: AC | PRN
  Administered 2024-05-08: 5.39 via INTRAVENOUS

## 2024-05-08 MED ORDER — FUROSEMIDE 10 MG/ML IJ SOLN
51.0000 mg | Freq: Once | INTRAMUSCULAR | Status: DC
  Filled 2024-05-08: qty 5.1

## 2024-05-12 ENCOUNTER — Ambulatory Visit (INDEPENDENT_AMBULATORY_CARE_PROVIDER_SITE_OTHER)

## 2024-05-12 DIAGNOSIS — E538 Deficiency of other specified B group vitamins: Secondary | ICD-10-CM | POA: Diagnosis not present

## 2024-05-12 MED ORDER — CYANOCOBALAMIN 1000 MCG/ML IJ SOLN
1000.0000 ug | Freq: Once | INTRAMUSCULAR | Status: AC
  Administered 2024-05-12: 1000 ug via INTRAMUSCULAR

## 2024-05-12 NOTE — Progress Notes (Signed)
 Pt received B12 injection in Left  deltoid muscle. Pt tolerated it well with no complaints or concerns.

## 2024-05-15 ENCOUNTER — Ambulatory Visit: Payer: Self-pay | Admitting: Urology

## 2024-05-15 ENCOUNTER — Other Ambulatory Visit: Payer: Self-pay | Admitting: *Deleted

## 2024-05-15 DIAGNOSIS — N201 Calculus of ureter: Secondary | ICD-10-CM

## 2024-05-21 ENCOUNTER — Inpatient Hospital Stay (HOSPITAL_COMMUNITY)

## 2024-05-21 ENCOUNTER — Encounter (HOSPITAL_COMMUNITY): Payer: Self-pay | Admitting: Emergency Medicine

## 2024-05-21 ENCOUNTER — Other Ambulatory Visit: Payer: Self-pay

## 2024-05-21 ENCOUNTER — Emergency Department (HOSPITAL_COMMUNITY)

## 2024-05-21 ENCOUNTER — Inpatient Hospital Stay (HOSPITAL_COMMUNITY)
Admission: EM | Admit: 2024-05-21 | Discharge: 2024-05-23 | DRG: 282 | Disposition: A | Discharge status: Home / Self Care | Attending: Cardiovascular Disease | Admitting: Cardiovascular Disease

## 2024-05-21 DIAGNOSIS — Z8249 Family history of ischemic heart disease and other diseases of the circulatory system: Secondary | ICD-10-CM | POA: Diagnosis not present

## 2024-05-21 DIAGNOSIS — E7800 Pure hypercholesterolemia, unspecified: Secondary | ICD-10-CM | POA: Diagnosis not present

## 2024-05-21 DIAGNOSIS — D259 Leiomyoma of uterus, unspecified: Secondary | ICD-10-CM | POA: Diagnosis not present

## 2024-05-21 DIAGNOSIS — R030 Elevated blood-pressure reading, without diagnosis of hypertension: Secondary | ICD-10-CM | POA: Diagnosis present

## 2024-05-21 DIAGNOSIS — Z7982 Long term (current) use of aspirin: Secondary | ICD-10-CM | POA: Diagnosis not present

## 2024-05-21 DIAGNOSIS — I514 Myocarditis, unspecified: Secondary | ICD-10-CM | POA: Diagnosis not present

## 2024-05-21 DIAGNOSIS — H35033 Hypertensive retinopathy, bilateral: Secondary | ICD-10-CM | POA: Diagnosis present

## 2024-05-21 DIAGNOSIS — R0789 Other chest pain: Secondary | ICD-10-CM | POA: Diagnosis not present

## 2024-05-21 DIAGNOSIS — E538 Deficiency of other specified B group vitamins: Secondary | ICD-10-CM | POA: Diagnosis present

## 2024-05-21 DIAGNOSIS — M5116 Intervertebral disc disorders with radiculopathy, lumbar region: Secondary | ICD-10-CM | POA: Diagnosis present

## 2024-05-21 DIAGNOSIS — K449 Diaphragmatic hernia without obstruction or gangrene: Secondary | ICD-10-CM | POA: Diagnosis not present

## 2024-05-21 DIAGNOSIS — N1831 Chronic kidney disease, stage 3a: Secondary | ICD-10-CM | POA: Diagnosis not present

## 2024-05-21 DIAGNOSIS — R079 Chest pain, unspecified: Principal | ICD-10-CM

## 2024-05-21 DIAGNOSIS — R7989 Other specified abnormal findings of blood chemistry: Secondary | ICD-10-CM | POA: Diagnosis not present

## 2024-05-21 DIAGNOSIS — I2510 Atherosclerotic heart disease of native coronary artery without angina pectoris: Secondary | ICD-10-CM | POA: Diagnosis not present

## 2024-05-21 DIAGNOSIS — I7090 Unspecified atherosclerosis: Secondary | ICD-10-CM | POA: Diagnosis not present

## 2024-05-21 DIAGNOSIS — Z6837 Body mass index (BMI) 37.0-37.9, adult: Secondary | ICD-10-CM

## 2024-05-21 DIAGNOSIS — E1122 Type 2 diabetes mellitus with diabetic chronic kidney disease: Secondary | ICD-10-CM | POA: Diagnosis present

## 2024-05-21 DIAGNOSIS — E785 Hyperlipidemia, unspecified: Secondary | ICD-10-CM | POA: Diagnosis present

## 2024-05-21 DIAGNOSIS — E119 Type 2 diabetes mellitus without complications: Secondary | ICD-10-CM | POA: Diagnosis not present

## 2024-05-21 DIAGNOSIS — I214 Non-ST elevation (NSTEMI) myocardial infarction: Principal | ICD-10-CM | POA: Diagnosis present

## 2024-05-21 DIAGNOSIS — Z79899 Other long term (current) drug therapy: Secondary | ICD-10-CM

## 2024-05-21 DIAGNOSIS — E66812 Obesity, class 2: Secondary | ICD-10-CM | POA: Diagnosis not present

## 2024-05-21 LAB — CBC WITH DIFFERENTIAL/PLATELET
Abs Immature Granulocytes: 0.01 K/uL (ref 0.00–0.07)
Basophils Absolute: 0 K/uL (ref 0.0–0.1)
Basophils Relative: 1 %
Eosinophils Absolute: 0.1 K/uL (ref 0.0–0.5)
Eosinophils Relative: 2 %
HCT: 40.6 % (ref 36.0–46.0)
Hemoglobin: 12.7 g/dL (ref 12.0–15.0)
Immature Granulocytes: 0 %
Lymphocytes Relative: 29 %
Lymphs Abs: 1.7 K/uL (ref 0.7–4.0)
MCH: 28 pg (ref 26.0–34.0)
MCHC: 31.3 g/dL (ref 30.0–36.0)
MCV: 89.4 fL (ref 80.0–100.0)
Monocytes Absolute: 0.4 K/uL (ref 0.1–1.0)
Monocytes Relative: 7 %
Neutro Abs: 3.6 K/uL (ref 1.7–7.7)
Neutrophils Relative %: 61 %
Platelets: 291 K/uL (ref 150–400)
RBC: 4.54 MIL/uL (ref 3.87–5.11)
RDW: 12.3 % (ref 11.5–15.5)
WBC: 5.9 K/uL (ref 4.0–10.5)
nRBC: 0 % (ref 0.0–0.2)

## 2024-05-21 LAB — TROPONIN I (HIGH SENSITIVITY)
Troponin I (High Sensitivity): 539 ng/L (ref <18)
Troponin I (High Sensitivity): 1019 ng/L (ref <18)

## 2024-05-21 LAB — COMPREHENSIVE METABOLIC PANEL WITH GFR
ALT: 15 U/L (ref 0–44)
AST: 21 U/L (ref 15–41)
Albumin: 3.9 g/dL (ref 3.5–5.0)
Alkaline Phosphatase: 87 U/L (ref 38–126)
Anion gap: 11 (ref 5–15)
BUN: 11 mg/dL (ref 8–23)
CO2: 22 mmol/L (ref 22–32)
Calcium: 9.9 mg/dL (ref 8.9–10.3)
Chloride: 104 mmol/L (ref 98–111)
Creatinine, Ser: 1.22 mg/dL — ABNORMAL HIGH (ref 0.44–1.00)
GFR, Estimated: 49 mL/min — ABNORMAL LOW (ref >60)
Glucose, Bld: 88 mg/dL (ref 70–99)
Potassium: 3.5 mmol/L (ref 3.5–5.1)
Sodium: 137 mmol/L (ref 135–145)
Total Bilirubin: 0.7 mg/dL (ref 0.0–1.2)
Total Protein: 7.2 g/dL (ref 6.5–8.1)

## 2024-05-21 LAB — CBG MONITORING, ED
Glucose-Capillary: 84 mg/dL (ref 70–99)
Glucose-Capillary: 108 mg/dL — ABNORMAL HIGH (ref 70–99)

## 2024-05-21 MED ORDER — HEPARIN BOLUS VIA INFUSION
4000.0000 [IU] | Freq: Once | INTRAVENOUS | Status: AC
  Administered 2024-05-21: 4000 [IU] via INTRAVENOUS
  Filled 2024-05-21: qty 4000

## 2024-05-21 MED ORDER — ASPIRIN 81 MG PO TBEC
81.0000 mg | DELAYED_RELEASE_TABLET | Freq: Every day | ORAL | Status: DC
  Administered 2024-05-22 – 2024-05-23 (×2): 81 mg via ORAL
  Filled 2024-05-21 (×2): qty 1

## 2024-05-21 MED ORDER — CARVEDILOL 6.25 MG PO TABS
6.2500 mg | ORAL_TABLET | Freq: Two times a day (BID) | ORAL | Status: DC
  Administered 2024-05-21 – 2024-05-23 (×3): 6.25 mg via ORAL
  Filled 2024-05-21 (×2): qty 2
  Filled 2024-05-21: qty 1

## 2024-05-21 MED ORDER — SODIUM CHLORIDE 0.9% FLUSH: 3.0000 mL | INTRAVENOUS | Status: DC | PRN

## 2024-05-21 MED ORDER — ONDANSETRON HCL 4 MG/2ML IJ SOLN: 4.0000 mg | Freq: Four times a day (QID) | INTRAMUSCULAR | Status: DC | PRN

## 2024-05-21 MED ORDER — SODIUM CHLORIDE 0.9% FLUSH
3.0000 mL | Freq: Two times a day (BID) | INTRAVENOUS | Status: DC
  Administered 2024-05-22 – 2024-05-23 (×3): 3 mL via INTRAVENOUS

## 2024-05-21 MED ORDER — FREE WATER
500.0000 mL | Freq: Once | Status: AC
  Administered 2024-05-22: 500 mL via ORAL

## 2024-05-21 MED ORDER — ATORVASTATIN CALCIUM 40 MG PO TABS
40.0000 mg | ORAL_TABLET | Freq: Every day | ORAL | Status: DC
  Administered 2024-05-22 – 2024-05-23 (×2): 40 mg via ORAL
  Filled 2024-05-21 (×2): qty 1

## 2024-05-21 MED ORDER — HEPARIN (PORCINE) 25000 UT/250ML-% IV SOLN
1000.0000 [IU]/h | INTRAVENOUS | Status: DC
  Administered 2024-05-21: 950 [IU]/h via INTRAVENOUS
  Filled 2024-05-21: qty 250

## 2024-05-21 MED ORDER — ACETAMINOPHEN 325 MG PO TABS: 650.0000 mg | ORAL_TABLET | ORAL | Status: DC | PRN

## 2024-05-21 MED ORDER — NITROGLYCERIN 0.4 MG SL SUBL: 0.4000 mg | SUBLINGUAL_TABLET | SUBLINGUAL | Status: DC | PRN

## 2024-05-21 MED ORDER — INSULIN ASPART 100 UNIT/ML IJ SOLN: 0.0000 [IU] | Freq: Three times a day (TID) | INTRAMUSCULAR | Status: DC

## 2024-05-21 MED ORDER — SODIUM CHLORIDE 0.9 % IV SOLN: 250.0000 mL | INTRAVENOUS | Status: AC | PRN

## 2024-05-21 MED ORDER — IOHEXOL 350 MG/ML SOLN
100.0000 mL | Freq: Once | INTRAVENOUS | Status: AC | PRN
Start: 2024-05-21 — End: 2024-05-21
  Administered 2024-05-21: 100 mL via INTRAVENOUS

## 2024-05-21 MED ORDER — LATANOPROST 0.005 % OP SOLN
1.0000 [drp] | Freq: Every day | OPHTHALMIC | Status: DC
  Administered 2024-05-22: 1 [drp] via OPHTHALMIC
  Filled 2024-05-21: qty 2.5

## 2024-05-21 NOTE — ED Notes (Signed)
 Pt denies CP at this time

## 2024-05-21 NOTE — ED Provider Notes (Signed)
  Physical Exam  BP (!) 169/83 (BP Location: Right Wrist)   Pulse 86   Temp 97.9 F (36.6 C) (Oral)   Resp 18   Ht 5' 4 (1.626 m)   Wt 99.8 kg   SpO2 100%   BMI 37.76 kg/m   Physical Exam Vitals and nursing note reviewed.  Constitutional:      General: She is not in acute distress.    Appearance: She is well-developed.  HENT:     Head: Normocephalic and atraumatic.  Eyes:     Conjunctiva/sclera: Conjunctivae normal.  Cardiovascular:     Rate and Rhythm: Normal rate and regular rhythm.     Heart sounds: No murmur heard. Pulmonary:     Effort: Pulmonary effort is normal. No respiratory distress.     Breath sounds: Normal breath sounds.  Abdominal:     Palpations: Abdomen is soft.     Tenderness: There is no abdominal tenderness.  Musculoskeletal:        General: No swelling.     Cervical back: Neck supple.  Skin:    General: Skin is warm and dry.     Capillary Refill: Capillary refill takes less than 2 seconds.  Neurological:     Mental Status: She is alert.  Psychiatric:        Mood and Affect: Mood normal.     Procedures  Procedures  ED Course / MDM    Medical Decision Making Amount and/or Complexity of Data Reviewed Radiology: ordered.   Patient presents because episode of chest pain today while singing.  Currently asymptomatic.  NSTEMI on labs.  Cardiology aware.  Will plan on CTA of the chest to rule out dissection.  If negative, will start patient on heparin  and admit to medicine for NSTEMI workup.  Already received 324 mg aspirin .       Simon Lavonia SAILOR, MD 05/21/24 1932

## 2024-05-21 NOTE — ED Provider Notes (Signed)
 [place] EMERGENCY DEPARTMENT AT [facility] Provider Note   CSN: [id number] Arrival date & time: 05/21/24  1137     Patient presents with: Chest Pain   Margaret Stark is a 68 y.o. female.  {Add pertinent medical, surgical, social history, OB history to HPI:7662} 68 year old female with prior medical history as detailed below presents for evaluation.  Patient complains of acute onset substernal chest pain.  Symptoms began at 1030 this morning.  She was singing in her church choir.  Symptoms lasted approximately 2 hours before resolution.  She denies chest pain now.  She was given 324 mg aspirin  and 1 sublingual nitro with EMS.  She reports that when the pain was most uncomfortable she felt like she might pass out.  She denies prior history of cardiac disease.  She has prior medical history including hyperlipidemia and diabetes.  The history is provided by the patient and medical records.       Prior to Admission medications   Medication Sig Start Date End Date Taking? Authorizing Provider  atorvastatin  (LIPITOR) 40 MG tablet Take 1 tablet (40 mg total) by mouth daily. 05/05/24   Gretel App, NP  BD PEN NEEDLE MICRO U/F 32G X 6 MM MISC USE ONCE DAILY WITH SAXENDA  04/23/20   Maribeth Camellia MATSU, MD  diclofenac  Sodium (VOLTAREN ) 1 % GEL Apply 2 g topically daily as needed (knee pain).    [provider]  latanoprost (XALATAN) 0.005 % ophthalmic solution Place 1 drop into both eyes at bedtime.    [provider]  Semaglutide , 2 MG/DOSE, 8 MG/3ML SOPN Inject 2 mg as directed once a week. 04/25/24   Gretel App, NP    Allergies: Patient has no known allergies.    Review of Systems  All other systems reviewed and are negative.   Updated Vital Signs BP 126/78   Pulse 83   Temp 97.8 F (36.6 C) (Oral)   Resp 16   Ht 5' 4 (1.626 m)   Wt 99.8 kg   SpO2 100%   BMI 37.76 kg/m   Physical Exam Vitals and nursing note reviewed.  Constitutional:       General: She is not in acute distress.    Appearance: Normal appearance. She is well-developed.  HENT:     Head: Normocephalic and atraumatic.  Eyes:     Conjunctiva/sclera: Conjunctivae normal.     Pupils: Pupils are equal, round, and reactive to light.  Cardiovascular:     Rate and Rhythm: Normal rate and regular rhythm.     Heart sounds: Normal heart sounds.  Pulmonary:     Effort: Pulmonary effort is normal. No respiratory distress.     Breath sounds: Normal breath sounds.  Abdominal:     General: There is no distension.     Palpations: Abdomen is soft.     Tenderness: There is no abdominal tenderness.  Musculoskeletal:        General: No deformity. Normal range of motion.     Cervical back: Normal range of motion and neck supple.  Skin:    General: Skin is warm and dry.  Neurological:     General: No focal deficit present.     Mental Status: She is alert and oriented to person, place, and time.     (all labs ordered are listed, but only abnormal results are displayed) Labs Reviewed  COMPREHENSIVE METABOLIC PANEL WITH GFR - Abnormal; Notable for the following components:      Result Value  Creatinine, Ser 1.22 (*)    GFR, Estimated 49 (*)    All other components within normal limits  TROPONIN I (HIGH SENSITIVITY) - Abnormal; Notable for the following components:   Troponin I (High Sensitivity) 539 (*)    All other components within normal limits  CBC WITH DIFFERENTIAL/PLATELET  TROPONIN I (HIGH SENSITIVITY)    EKG: EKG Interpretation Date/Time:  Sunday May 21 2024 11:46:20 EDT Ventricular Rate:  80 PR Interval:  184 QRS Duration:  98 QT Interval:  360 QTC Calculation: 415 R Axis:   -6  Text Interpretation: Normal sinus rhythm Normal ECG When compared with ECG of 21-Feb-2024 13:57, PREVIOUS ECG IS PRESENT Slight ST elevation in V1-2 But not significantly changed from 2020 Confirmed by Zackowski, Scott [phone]) on 05/21/2024 11:52:08  AM  Radiology: [facility] Chest 2 View Result Date: 05/21/2024 EXAM: 2 VIEW(S) XRAY OF THE CHEST 05/21/2024 12:59:00 PM COMPARISON: 11/24/18. CLINICAL HISTORY: Chest pain. Chest pain; hx of CAD, diabetes FINDINGS: LUNGS AND PLEURA: No focal pulmonary opacity. No pulmonary edema. No pleural effusion. No pneumothorax. HEART AND MEDIASTINUM: No acute abnormality of the cardiac and mediastinal silhouettes. BONES AND SOFT TISSUES: Diffuse thoracic endplate osteophytes. IMPRESSION: 1. No acute cardiopulmonary process identified. Electronically signed by: Waddell Calk MD 05/21/2024 01:05 PM EDT RP Workstation: [id number]    {Document cardiac monitor, telemetry assessment procedure when appropriate:32947} Procedures   Medications Ordered in the ED - No data to display    {Click here for ABCD2, HEART and other calculators REFRESH Note before signing:1}                              Medical Decision Making Patient with acute onset chest pain this morning.  Pain lasted approximately 2 hours prior to resolution.  Patient was given aspirin  324 mg and 1 sublingual nitroglycerin  prior to arrival.  Patient is pain-free now.  Initial EKGs are without clear evidence of acute ischemia.  Initial troponin is 539.  Cardiology (Dr. Kennyth) made aware of case.  He agrees with plan to rule out aortic dissection.  If dissection study is negative, cardiology agrees with plan to initiate heparin  drip and admit to medicine service for further workup.  Cardiology will consult.  Amount and/or Complexity of Data Reviewed Radiology: ordered.   CRITICAL CARE Performed by: Maude JAYSON Galloway   Total critical care time: 30 minutes  Critical care time was exclusive of separately billable procedures and treating other patients.  Critical care was necessary to treat or prevent imminent or life-threatening deterioration.  Critical care was time spent personally by me on the following activities: development of treatment plan with  patient and/or surrogate as well as nursing, discussions with consultants, evaluation of patient's response to treatment, examination of patient, obtaining history from patient or surrogate, ordering and performing treatments and interventions, ordering and review of laboratory studies, ordering and review of radiographic studies, pulse oximetry and re-evaluation of patient's condition.   {Document critical care time when appropriate  Document review of labs and clinical decision tools ie CHADS2VASC2, etc  Document your independent review of radiology images and any outside records  Document your discussion with family members, caretakers and with consultants  Document social determinants of health affecting pt's care  Document your decision making why or why not admission, treatments were needed:32947:::1}   Final diagnoses:  Chest pain, unspecified type  Elevated troponin    ED Discharge Orders     None

## 2024-05-21 NOTE — ED Provider Triage Note (Signed)
 Emergency Medicine Provider Triage Evaluation Note  Margaret Stark , a 68 y.o. female  was evaluated in triage.  Pt complains of substernal chest pain onset at 1030 this morning.  Not associated with nausea vomiting or shortness of breath.  EMS did give her aspirin  and nitroglycerin  with some improvement but she still has some discomfort there.  No radiation of the pain nothing makes it worse or better.  Does not have a known history of coronary disease.  But does have risk factors of diabetes and high cholesterol..  Review of Systems  Positive: Chest pain Negative: Shortness of breath nausea vomiting, abdominal pain  Physical Exam  BP 126/78   Pulse 83   Temp 97.8 F (36.6 C) (Oral)   Resp 16   Ht 1.626 m (5' 4)   Wt 99.8 kg   SpO2 100%   BMI 37.76 kg/m  Gen: Awake, no distress   Resp: Normal effort lungs clear bilaterally Heart: Regular rate and rhythm no murmur MSK: Moves extremities without difficulty no lower extremity edema Other:   Medical Decision Making  Medically screening exam initiated at 11:45 AM.  Appropriate orders placed.  Margaret Stark was informed that the remainder of the evaluation will be completed by another provider, this initial triage assessment does not replace that evaluation, and the importance of remaining in the ED until their evaluation is complete.  EKG with some subtle changes in V1 and V2 but looking back on EKGs from July 2025 and in 2020 no significant change.  No evidence of a STEMI.  Patient still has chest pain.  Will get troponins basic labs IV placement and chest x-ray.   Alanda Colton, MD 05/21/24 1155

## 2024-05-21 NOTE — Progress Notes (Signed)
 Reviewed CTA result with Dr. Barbaraann. OK to start IV heparin . OK to eat.

## 2024-05-21 NOTE — ED Triage Notes (Signed)
 Pt [place] via EMS from church with sudden CP while singing in choir. Pain 8/10 and down to 5/10 after 324 ASA and 1 nitrog. Pressure type pain in center of chest with no radiation.

## 2024-05-21 NOTE — H&P (Signed)
 Cardiology Admission History and Physical:  Patient ID: Margaret Stark MRN: [id number] DOB: Nov 05, 1955  Admit date: 05/21/2024  Primary Care Provider: Gretel App, NP Primary Cardiologist: None  Primary Electrophysiologist:  None   Chief Complaint:  Chest pain  Patient Profile:  Margaret Stark is a 68 y.o. female with nonobstructive CAD, diabetes, hyperlipidemia who presents for evaluation of non-STEMI.  History of Present Illness:  Margaret Stark presents with acute chest pain that began at church today.  She describes singing in church and developing acute chest pressure.  Described as squeezing pain.  She had associated shortness of breath and diaphoresis.  She tells me her symptoms have improved.  She is not having any more chest discomfort.  [facility] arrival to [facility] she was hypertensive with blood pressure 178/70.  Labs notable for serum creatinine 1.22 which is at baseline.  Troponin 539.  WBCs 5.9 hemoglobin 12.7.  She tells me her chest symptoms have resolved.  EKG demonstrates sinus rhythm heart rate 88 with no acute ischemic changes.  Past Medical History: Past Medical History:  Diagnosis Date   Arthritis    B12 deficiency    CAD (coronary artery disease)    a. LHC 11/24/2018: mild non-obstructive CAD with 30% stenosis of proximal LAD and 10% stenosis of  proximal to mid RCA   Diabetes mellitus without complication (HCC)    Hyperlipidemia, unspecified hyperlipidemia type    Hypertensive retinopathy, bilateral    Intervertebral disc disorders with radiculopathy, lumbar region    Ocular hypertension, bilateral    Right upper quadrant abdominal pain     Past Surgical History: Past Surgical History:  Procedure Laterality Date   COLONOSCOPY WITH PROPOFOL  N/A 07/15/2015   Procedure: COLONOSCOPY WITH PROPOFOL ;  Surgeon: Deward CINDERELLA Piedmont, MD;  Location: [facility] ENDOSCOPY;  Service: Gastroenterology;  Laterality: N/A;   CYSTOSCOPY/URETEROSCOPY/HOLMIUM LASER/STENT PLACEMENT Left  02/22/2024   Procedure: CYSTOSCOPY/URETEROSCOPY/HOLMIUM LASER/STENT PLACEMENT;  Surgeon: Twylla Glendia BROCKS, MD;  Location: [facility] ORS;  Service: Urology;  Laterality: Left;   LEFT HEART CATH AND CORONARY ANGIOGRAPHY N/A 11/24/2018   Procedure: LEFT HEART CATH AND CORONARY ANGIOGRAPHY;  Surgeon: Verlin Lonni BIRCH, MD;  Location: [facility] INVASIVE CV LAB;  Service: Cardiovascular;  Laterality: N/A;     Allergies:    No Known Allergies  Social History:   Social History   Socioeconomic History   Marital status: Married    Spouse name: Johnnie   Number of children: 0   Years of education: Not on file   Highest education level: Master's degree (e.g., MA, MS, MEng, MEd, MSW, MBA)  Occupational History   Occupation: retired runner, broadcasting/film/video, 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 Retired engineer, site, but still works part time engineer, manufacturing   Social Drivers of Corporate Investment Banker Strain: Low Risk  (01/29/2024)   Overall Financial Resource Strain (CARDIA)    Difficulty of Paying Living Expenses: Not hard at all  Food Insecurity: No Food Insecurity (01/29/2024)   Hunger Vital Sign  Worried About Programme Researcher, Broadcasting/film/video in the Last Year: Never true    Ran Out of Food in the Last Year: Never true  Transportation Needs: No Transportation Needs (01/29/2024)   PRAPARE - Administrator, Civil Service (Medical): No    Lack of Transportation (Non-Medical): No  Physical Activity: Insufficiently Active (01/29/2024)   Exercise Vital Sign    Days of Exercise per Week: 1 day    Minutes of Exercise per  Session: 20 min  Stress: No Stress Concern Present (01/29/2024)   [employer] of Occupational Health - Occupational Stress Questionnaire    Feeling of Stress: Not at all  Social Connections: Socially Integrated (01/29/2024)   Social Connection and Isolation Panel    Frequency of Communication with Friends and Family: More than three times a week    Frequency of Social Gatherings with Friends and Family: Twice a week    Attends Religious Services: More than 4 times per year    Active Member of [employer] or Organizations: Yes    Attends Engineer, Structural: More than 4 times per year    Marital Status: Married  Catering Manager Violence: Not At Risk (02/02/2024)   Humiliation, Afraid, Rape, and Kick questionnaire    Fear of Current or Ex-Partner: No    Emotionally Abused: No    Physically Abused: No    Sexually Abused: No     Family History:   The patient's family history includes Cancer in her sister; Hypertension in her father and mother. There is no history of Heart disease.    ROS:  All other ROS reviewed and negative. Pertinent positives noted in the HPI.     Physical Exam/Data:   Vitals:   05/21/24 1142 05/21/24 1430 05/21/24 1515 05/21/24 1530  BP: 126/78 (!) 169/83 134/74 (!) 178/70  Pulse: 83 86 82 84  Resp: 16 18 12 18   Temp: 97.8 F (36.6 C) 97.9 F (36.6 C)    TempSrc: Oral Oral    SpO2: 100% 100% 99% 100%  Weight: 99.8 kg     Height: 5' 4 (1.626 m)      No intake or output data in the 24 hours ending 05/21/24 1541     05/21/2024   11:42 AM 04/25/2024    3:26 PM 03/15/2024   11:35 AM  Last 3 Weights  Weight (lbs) 220 lb 226 lb 6.4 oz 250 lb  Weight (kg) 99.791 kg 102.694 kg 113.399 kg    Body mass index is 37.76 kg/m.  General: Well nourished, well developed, in no acute distress Head: Atraumatic, normal size  Eyes: PEERLA, EOMI  Neck: Supple, no JVD Endocrine: No thryomegaly Cardiac: Normal S1, S2; RRR; no murmurs, rubs, or gallops Lungs: Clear  to auscultation bilaterally, no wheezing, rhonchi or rales  Abd: Soft, nontender, no hepatomegaly  Ext: No edema, pulses 2+ Musculoskeletal: No deformities, BUE and BLE strength normal and equal Skin: Warm and dry, no rashes   Neuro: Alert and oriented to person, place, time, and situation, CNII-XII grossly intact, no focal deficits  Psych: Normal mood and affect   EKG:  The ECG that was done was personally reviewed and demonstrates SR 88, no acute changes   Relevant CV Studies: LHC 11/24/2018 Prox LAD lesion is 30% stenosed. Prox RCA to Mid RCA lesion is 10% stenosed. The left ventricular systolic function is normal. LV end diastolic pressure is normal. The left ventricular ejection fraction is greater than 65% by visual estimate. There is  no mitral valve regurgitation.   1. Mild non-obstructive disease in the mid LAD and proximal RCA 2. No evidence of coronary artery spasm or SCAD (spontaneous coronary artery dissection) 3. Normal LV systolic function  Assessment and Plan:   # NSTEMI - Presents with acute chest discomfort and elevated troponins.  Symptoms have resolved.  EKG is nonischemic. - Emergency room is planning for CTA to exclude dissection. - Assuming dissection study is negative we will admit to cardiology service.  Plan for aspirin  and heparin  drip.  N.p.o. for left heart catheterization tomorrow.  I have a low suspicion for dissection but we will exclude this based on emergency room assessment. - We will start her on carvedilol 6.25 mg twice daily.  Blood pressure is a bit elevated but she tells me it is not elevated at home. - Continue home statin.  Check A1c, lipids, TSH, LPA. - Check echo. - Risk and benefits of cath explained.  She is willing to proceed.  # DM - Reports controlled diabetes.  Slight scale insulin .  Recheck A1c.  Hold oral agents.  # Elevated BP - No formal diagnosis of hypertension per her report.  Adding carvedilol.  # HLD - Continue home  statin.  FEN - Precath fluids - Code: Full - DVT PPx: Heparin  drip - Diet: Heart healthy, n.p.o. at midnight - Disposition: Pending left heart cath tomorrow.  Severity of Illness: The appropriate patient status for this patient is INPATIENT. Inpatient status is judged to be reasonable and necessary in order to provide the required intensity of service to ensure the patient's safety. The patient's presenting symptoms, physical exam findings, and initial radiographic and laboratory data in the context of their chronic comorbidities is felt to place them at high risk for further clinical deterioration. Furthermore, it is not anticipated that the patient will be medically stable for discharge from the hospital within 2 midnights of admission.   * I certify that at the point of admission it is my clinical judgment that the patient will require inpatient hospital care spanning beyond 2 midnights from the point of admission due to high intensity of service, high risk for further deterioration and high frequency of surveillance required.*   For questions or updates, please contact [facility] Please consult [email or web address] for contact info under    Signed, Darryle T. Barbaraann, MD, [facility]   [facility]  05/21/2024 3:41 PM

## 2024-05-21 NOTE — Progress Notes (Signed)
 ANTICOAGULATION CONSULT NOTE  Pharmacy Consult for Heparin  Indication: chest pain/ACS  No Known Allergies  Patient Measurements: Height: 5' 4 (162.6 cm) Weight: 99.8 kg (220 lb) IBW/kg (Calculated) : 54.7 Heparin  Dosing Weight: 77.8 kg  Vital Signs: Temp: 97.9 F (36.6 C) (10/26 1430) Temp Source: Oral (10/26 1430) BP: 112/76 (10/26 1707) Pulse Rate: 88 (10/26 1707)  Labs: Recent Labs    05/21/24 1305 05/21/24 1451  HGB 12.7  --   HCT 40.6  --   PLT 291  --   CREATININE 1.22*  --   TROPONINIHS 539* 1,019*    Estimated Creatinine Clearance: 51.4 mL/min (A) (by C-G formula based on SCr of 1.22 mg/dL (H)).   Medical History: Past Medical History:  Diagnosis Date   Arthritis    B12 deficiency    CAD (coronary artery disease)    a. LHC 11/24/2018: mild non-obstructive CAD with 30% stenosis of proximal LAD and 10% stenosis of  proximal to mid RCA   Diabetes mellitus without complication (HCC)    Hyperlipidemia, unspecified hyperlipidemia type    Hypertensive retinopathy, bilateral    Intervertebral disc disorders with radiculopathy, lumbar region    Ocular hypertension, bilateral    Right upper quadrant abdominal pain     Medications:  (Not in a hospital admission)  Scheduled:   [START ON 05/22/2024] aspirin  EC  81 mg Oral Daily   [START ON 05/22/2024] atorvastatin   40 mg Oral Daily   carvedilol  6.25 mg Oral BID WC   [START ON 05/22/2024] free water  500 mL Oral Once   insulin  aspart  0-9 Units Subcutaneous TID WC   latanoprost  1 drop Both Eyes QHS   sodium chloride  flush  3 mL Intravenous Q12H   Infusions:   sodium chloride      PRN: sodium chloride , acetaminophen , nitroGLYCERIN , ondansetron  (ZOFRAN ) IV, sodium chloride  flush  Assessment: 16 yof with a history of CAD, diabetes, HLD. Patient is presenting with chest pain. Heparin  per pharmacy consult placed for chest pain/ACS.  CTA w/out definite aortic dissection or aneurysm  Patient is not on  anticoagulation prior to arrival.  Hgb 12.7; plt 291 hsTrop 1019  Goal of Therapy:  Heparin  level 0.3-0.7 units/ml Monitor platelets by anticoagulation protocol: Yes   Plan:  Give IV heparin  4000 units bolus x 1 Start heparin  infusion at 950 units/hr Check anti-Xa level in 8 hours and daily while on heparin  Continue to monitor H&H and platelets  Dorn Buttner, PharmD, BCPS 05/21/2024 6:13 PM ED Clinical Pharmacist -  [phone]

## 2024-05-22 ENCOUNTER — Encounter (HOSPITAL_COMMUNITY)
Admission: EM | Disposition: A | Discharge status: Home / Self Care | Payer: Self-pay | Source: Home / Self Care | Attending: Cardiovascular Disease

## 2024-05-22 ENCOUNTER — Inpatient Hospital Stay (HOSPITAL_COMMUNITY)

## 2024-05-22 DIAGNOSIS — I214 Non-ST elevation (NSTEMI) myocardial infarction: Secondary | ICD-10-CM

## 2024-05-22 DIAGNOSIS — I2510 Atherosclerotic heart disease of native coronary artery without angina pectoris: Secondary | ICD-10-CM

## 2024-05-22 DIAGNOSIS — N1831 Chronic kidney disease, stage 3a: Secondary | ICD-10-CM | POA: Diagnosis not present

## 2024-05-22 DIAGNOSIS — E785 Hyperlipidemia, unspecified: Secondary | ICD-10-CM | POA: Diagnosis not present

## 2024-05-22 HISTORY — PX: LEFT HEART CATH AND CORONARY ANGIOGRAPHY: CATH118249

## 2024-05-22 LAB — BASIC METABOLIC PANEL WITH GFR
Anion gap: 10 (ref 5–15)
BUN: 12 mg/dL (ref 8–23)
CO2: 22 mmol/L (ref 22–32)
Calcium: 9.4 mg/dL (ref 8.9–10.3)
Chloride: 108 mmol/L (ref 98–111)
Creatinine, Ser: 1.31 mg/dL — ABNORMAL HIGH (ref 0.44–1.00)
GFR, Estimated: 45 mL/min — ABNORMAL LOW (ref >60)
Glucose, Bld: 101 mg/dL — ABNORMAL HIGH (ref 70–99)
Potassium: 3.9 mmol/L (ref 3.5–5.1)
Sodium: 140 mmol/L (ref 135–145)

## 2024-05-22 LAB — LIPID PANEL
Cholesterol: 127 mg/dL (ref 0–200)
HDL: 41 mg/dL (ref >40)
LDL Cholesterol: 70 mg/dL (ref 0–99)
Total CHOL/HDL Ratio: 3.1 ratio
Triglycerides: 79 mg/dL (ref <150)
VLDL: 16 mg/dL (ref 0–40)

## 2024-05-22 LAB — ECHOCARDIOGRAM COMPLETE
AR max vel: 1.97 cm2
AV Area VTI: 1.99 cm2
AV Area mean vel: 1.85 cm2
AV Mean grad: 6 mmHg
AV Peak grad: 11 mmHg
Ao pk vel: 1.66 m/s
Area-P 1/2: 4.68 cm2
Calc EF: 61.9 %
Height: 64 in
MV M vel: 3.95 m/s
MV Peak grad: 62.4 mmHg
MV VTI: 2.3 cm2
S' Lateral: 2.3 cm
Single Plane A2C EF: 58.5 %
Single Plane A4C EF: 66.1 %
Weight: 3520 [oz_av]

## 2024-05-22 LAB — CBC
HCT: 35 % — ABNORMAL LOW (ref 36.0–46.0)
Hemoglobin: 11.3 g/dL — ABNORMAL LOW (ref 12.0–15.0)
MCH: 28.7 pg (ref 26.0–34.0)
MCHC: 32.3 g/dL (ref 30.0–36.0)
MCV: 88.8 fL (ref 80.0–100.0)
Platelets: 264 K/uL (ref 150–400)
RBC: 3.94 MIL/uL (ref 3.87–5.11)
RDW: 12.7 % (ref 11.5–15.5)
WBC: 5.5 K/uL (ref 4.0–10.5)
nRBC: 0 % (ref 0.0–0.2)

## 2024-05-22 LAB — CBG MONITORING, ED
Glucose-Capillary: 91 mg/dL (ref 70–99)
Glucose-Capillary: 91 mg/dL (ref 70–99)

## 2024-05-22 LAB — HEMOGLOBIN A1C
Hgb A1c MFr Bld: 5.7 % — ABNORMAL HIGH (ref 4.8–5.6)
Mean Plasma Glucose: 116.89 mg/dL

## 2024-05-22 LAB — HEPARIN LEVEL (UNFRACTIONATED)
Heparin Unfractionated: 0.52 [IU]/mL (ref 0.30–0.70)
Heparin Unfractionated: 0.36 [IU]/mL (ref 0.30–0.70)

## 2024-05-22 LAB — GLUCOSE, CAPILLARY: Glucose-Capillary: 114 mg/dL — ABNORMAL HIGH (ref 70–99)

## 2024-05-22 LAB — TSH: TSH: 3.02 u[IU]/mL (ref 0.350–4.500)

## 2024-05-22 LAB — HIV ANTIBODY (ROUTINE TESTING W REFLEX): HIV Screen 4th Generation wRfx: NONREACTIVE

## 2024-05-22 SURGERY — LEFT HEART CATH AND CORONARY ANGIOGRAPHY
Anesthesia: LOCAL

## 2024-05-22 MED ORDER — HEPARIN (PORCINE) IN NACL 1000-0.9 UT/500ML-% IV SOLN
INTRAVENOUS | Status: DC | PRN
  Administered 2024-05-22: 1000 mL

## 2024-05-22 MED ORDER — MIDAZOLAM HCL (PF) 2 MG/2ML IJ SOLN
INTRAMUSCULAR | Status: DC | PRN
  Administered 2024-05-22: 1 mg via INTRAVENOUS

## 2024-05-22 MED ORDER — ENOXAPARIN SODIUM 40 MG/0.4ML IJ SOSY
40.0000 mg | PREFILLED_SYRINGE | INTRAMUSCULAR | Status: DC
  Administered 2024-05-22: 40 mg via SUBCUTANEOUS
  Filled 2024-05-22: qty 0.4

## 2024-05-22 MED ORDER — VERAPAMIL HCL 2.5 MG/ML IV SOLN
INTRAVENOUS | Status: DC | PRN
  Administered 2024-05-22: 10 mL via INTRA_ARTERIAL

## 2024-05-22 MED ORDER — SODIUM CHLORIDE 0.9% FLUSH
3.0000 mL | Freq: Two times a day (BID) | INTRAVENOUS | Status: DC
  Administered 2024-05-22 – 2024-05-23 (×2): 3 mL via INTRAVENOUS

## 2024-05-22 MED ORDER — IOHEXOL 350 MG/ML SOLN
INTRAVENOUS | Status: DC | PRN
  Administered 2024-05-22: 50 mL

## 2024-05-22 MED ORDER — FREE WATER
500.0000 mL | Freq: Once | Status: AC
  Administered 2024-05-22: 500 mL via ORAL

## 2024-05-22 MED ORDER — LABETALOL HCL 5 MG/ML IV SOLN: 10.0000 mg | INTRAVENOUS | Status: AC | PRN

## 2024-05-22 MED ORDER — FENTANYL CITRATE (PF) 100 MCG/2ML IJ SOLN
INTRAMUSCULAR | Status: DC | PRN
  Administered 2024-05-22: 25 ug via INTRAVENOUS

## 2024-05-22 MED ORDER — ASPIRIN 81 MG PO CHEW: 81.0000 mg | CHEWABLE_TABLET | ORAL | Status: DC

## 2024-05-22 MED ORDER — HEPARIN SODIUM (PORCINE) 1000 UNIT/ML IJ SOLN
INTRAMUSCULAR | Status: AC
  Filled 2024-05-22: qty 10

## 2024-05-22 MED ORDER — VERAPAMIL HCL 2.5 MG/ML IV SOLN
INTRAVENOUS | Status: AC
  Filled 2024-05-22: qty 2

## 2024-05-22 MED ORDER — SODIUM CHLORIDE 0.9 % IV SOLN
250.0000 mL | INTRAVENOUS | Status: DC | PRN
Start: 2024-05-22 — End: 2024-05-23

## 2024-05-22 MED ORDER — HYDRALAZINE HCL 20 MG/ML IJ SOLN: 10.0000 mg | INTRAMUSCULAR | Status: AC | PRN

## 2024-05-22 MED ORDER — LIDOCAINE HCL (PF) 1 % IJ SOLN
INTRAMUSCULAR | Status: DC | PRN
  Administered 2024-05-22: 2 mL

## 2024-05-22 MED ORDER — ENOXAPARIN SODIUM 40 MG/0.4ML IJ SOSY
40.0000 mg | PREFILLED_SYRINGE | INTRAMUSCULAR | Status: DC
Start: 2024-05-23 — End: 2024-05-22

## 2024-05-22 MED ORDER — FENTANYL CITRATE (PF) 100 MCG/2ML IJ SOLN
INTRAMUSCULAR | Status: AC
  Filled 2024-05-22: qty 2

## 2024-05-22 MED ORDER — HEPARIN SODIUM (PORCINE) 1000 UNIT/ML IJ SOLN
INTRAMUSCULAR | Status: DC | PRN
  Administered 2024-05-22: 5000 [IU] via INTRAVENOUS

## 2024-05-22 MED ORDER — SODIUM CHLORIDE 0.9% FLUSH: 3.0000 mL | INTRAVENOUS | Status: DC | PRN

## 2024-05-22 MED ORDER — MIDAZOLAM HCL 2 MG/2ML IJ SOLN
INTRAMUSCULAR | Status: AC
  Filled 2024-05-22: qty 2

## 2024-05-22 SURGICAL SUPPLY — 8 items
CATH INFINITI 5 FR JL3.5 (CATHETERS) IMPLANT
CATH INFINITI JR4 5F (CATHETERS) IMPLANT
DEVICE RAD COMP TR BAND LRG (VASCULAR PRODUCTS) IMPLANT
GLIDESHEATH SLEND SS 6F .021 (SHEATH) IMPLANT
GUIDEWIRE INQWIRE 1.5J.035X260 (WIRE) IMPLANT
KIT SYRINGE INJ CVI SPIKEX1 (MISCELLANEOUS) IMPLANT
PACK CARDIAC CATHETERIZATION (CUSTOM PROCEDURE TRAY) ×1 IMPLANT
SET ATX-X65L (MISCELLANEOUS) IMPLANT

## 2024-05-22 NOTE — Interval H&P Note (Signed)
 History and Physical Interval Note:  05/22/2024 3:07 PM  Margaret Stark  has presented today for surgery, with the diagnosis of NSTEMI.  The various methods of treatment have been discussed with the patient and family. After consideration of risks, benefits and other options for treatment, the patient has consented to  Procedure(s): LEFT HEART CATH AND CORONARY ANGIOGRAPHY (N/A) as a surgical intervention.  The patient's history has been reviewed, patient examined, no change in status, stable for surgery.  I have reviewed the patient's chart and labs.  Questions were answered to the patient's satisfaction.    Cath Lab Visit (complete for each Cath Lab visit)  Clinical Evaluation Leading to the Procedure:   ACS: Yes.    Non-ACS:  N/A  Jozlin Bently

## 2024-05-22 NOTE — Progress Notes (Signed)
 TR BAND REMOVAL  LOCATION: Right   radial  DEFLATED PER PROTOCOL:   Yes  TIME BAND OFF / DRESSING APPLIED:    1845  SITE UPON ARRIVAL:    Level 0  SITE AFTER BAND REMOVAL:    Level 0  CIRCULATION SENSATION AND MOVEMENT:    Within Normal Limits : yes  COMMENTS:

## 2024-05-22 NOTE — Progress Notes (Signed)
  Cardiology Progress Note  Patient ID: Margaret Stark MRN: [id number] DOB: 02/22/1956 Date of Encounter: 05/22/2024 Primary Cardiologist: None  Subjective   Chief Complaint: None.   HPI: Denies chest pain.  N.p.o. for left heart cath today  ROS:  All other ROS reviewed and negative. Pertinent positives noted in the HPI.     Telemetry  Overnight telemetry shows sinus rhythm 70s, which I personally reviewed.    Physical Exam   Vitals:   05/22/24 0500 05/22/24 0516 05/22/24 0600 05/22/24 0907  BP: 128/71  134/78 119/68  Pulse: 79  83 78  Resp: 13  13 14   Temp:  98.2 F (36.8 C)  98.6 F (37 C)  TempSrc:  Oral  Oral  SpO2: 98%  95% 100%  Weight:      Height:        Intake/Output Summary (Last 24 hours) at 05/22/2024 0918 Last data filed at 05/22/2024 0430 Gross per 24 hour  Intake 500 ml  Output --  Net 500 ml       05/21/2024   11:42 AM 04/25/2024    3:26 PM 03/15/2024   11:35 AM  Last 3 Weights  Weight (lbs) 220 lb 226 lb 6.4 oz 250 lb  Weight (kg) 99.791 kg 102.694 kg 113.399 kg    Body mass index is 37.76 kg/m.  General: Well nourished, well developed, in no acute distress Head: Atraumatic, normal size  Eyes: PEERLA, EOMI  Neck: Supple, no JVD Endocrine: No thryomegaly Cardiac: Normal S1, S2; RRR; no murmurs, rubs, or gallops Lungs: Clear to auscultation bilaterally, no wheezing, rhonchi or rales  Abd: Soft, nontender, no hepatomegaly  Ext: No edema, pulses 2+ Musculoskeletal: No deformities, BUE and BLE strength normal and equal Skin: Warm and dry, no rashes   Neuro: Alert and oriented to person, place, time, and situation, CNII-XII grossly intact, no focal deficits  Psych: Normal mood and affect   Cardiac Studies  Echo pending  Left heart cath 11/24/2018: Minimal nonobstructive disease  Patient Profile  Margaret Stark is a 68 y.o. female with nonobstructive CAD, diabetes, hyperlipidemia, CKD 3a admitted 05/21/2024 for  non-STEMI.  Assessment & Plan   # NSTEMI - Admitted with chest pressure and elevated troponins.  No further chest pain.  N.p.o. for left heart cath today. - Follow-up echo. - Continue Lipitor 40 mg daily. - Continue Coreg 6.25 mg twice daily. - On heparin  drip. - LDL 70.  TSH 3.0.  A1c 5.7. - If cath is negative will need cardiac MRI.  Informed Consent   Shared Decision Making/Informed Consent The risks [stroke (1 in 1000), death (1 in 1000), kidney failure [usually temporary] (1 in 500), bleeding (1 in 200), allergic reaction [possibly serious] (1 in 200)], benefits (diagnostic support and management of coronary artery disease) and alternatives of a cardiac catheterization were discussed in detail with Margaret Stark and she is willing to proceed.     # HLD - Lipitor 40 mg daily.  # Diabetes - A1c 5.7.  Does not need SSI.   # CKD 3a - Stable  FEN - no IVF - Code: Full - DVT PPx: Heparin  drip - Diet: N.p.o. - Disposition: Pending left heart cath  For questions or updates, please contact [facility] Please consult [email or web address] for contact info under     Signed, Darryle T. Barbaraann, MD, [facility]  [facility]  05/22/2024 9:18 AM

## 2024-05-22 NOTE — H&P (View-Only) (Signed)
  Cardiology Progress Note  Patient ID: Margaret Stark MRN: [id number] DOB: 02/22/1956 Date of Encounter: 05/22/2024 Primary Cardiologist: None  Subjective   Chief Complaint: None.   HPI: Denies chest pain.  N.p.o. for left heart cath today  ROS:  All other ROS reviewed and negative. Pertinent positives noted in the HPI.     Telemetry  Overnight telemetry shows sinus rhythm 70s, which I personally reviewed.    Physical Exam   Vitals:   05/22/24 0500 05/22/24 0516 05/22/24 0600 05/22/24 0907  BP: 128/71  134/78 119/68  Pulse: 79  83 78  Resp: 13  13 14   Temp:  98.2 F (36.8 C)  98.6 F (37 C)  TempSrc:  Oral  Oral  SpO2: 98%  95% 100%  Weight:      Height:        Intake/Output Summary (Last 24 hours) at 05/22/2024 0918 Last data filed at 05/22/2024 0430 Gross per 24 hour  Intake 500 ml  Output --  Net 500 ml       05/21/2024   11:42 AM 04/25/2024    3:26 PM 03/15/2024   11:35 AM  Last 3 Weights  Weight (lbs) 220 lb 226 lb 6.4 oz 250 lb  Weight (kg) 99.791 kg 102.694 kg 113.399 kg    Body mass index is 37.76 kg/m.  General: Well nourished, well developed, in no acute distress Head: Atraumatic, normal size  Eyes: PEERLA, EOMI  Neck: Supple, no JVD Endocrine: No thryomegaly Cardiac: Normal S1, S2; RRR; no murmurs, rubs, or gallops Lungs: Clear to auscultation bilaterally, no wheezing, rhonchi or rales  Abd: Soft, nontender, no hepatomegaly  Ext: No edema, pulses 2+ Musculoskeletal: No deformities, BUE and BLE strength normal and equal Skin: Warm and dry, no rashes   Neuro: Alert and oriented to person, place, time, and situation, CNII-XII grossly intact, no focal deficits  Psych: Normal mood and affect   Cardiac Studies  Echo pending  Left heart cath 11/24/2018: Minimal nonobstructive disease  Patient Profile  Margaret Stark is a 68 y.o. female with nonobstructive CAD, diabetes, hyperlipidemia, CKD 3a admitted 05/21/2024 for  non-STEMI.  Assessment & Plan   # NSTEMI - Admitted with chest pressure and elevated troponins.  No further chest pain.  N.p.o. for left heart cath today. - Follow-up echo. - Continue Lipitor 40 mg daily. - Continue Coreg 6.25 mg twice daily. - On heparin  drip. - LDL 70.  TSH 3.0.  A1c 5.7. - If cath is negative will need cardiac MRI.  Informed Consent   Shared Decision Making/Informed Consent The risks [stroke (1 in 1000), death (1 in 1000), kidney failure [usually temporary] (1 in 500), bleeding (1 in 200), allergic reaction [possibly serious] (1 in 200)], benefits (diagnostic support and management of coronary artery disease) and alternatives of a cardiac catheterization were discussed in detail with Margaret Stark and she is willing to proceed.     # HLD - Lipitor 40 mg daily.  # Diabetes - A1c 5.7.  Does not need SSI.   # CKD 3a - Stable  FEN - no IVF - Code: Full - DVT PPx: Heparin  drip - Diet: N.p.o. - Disposition: Pending left heart cath  For questions or updates, please contact [facility] Please consult [email or web address] for contact info under     Signed, Margaret T. Barbaraann, MD, [facility]  [facility]  05/22/2024 9:18 AM

## 2024-05-22 NOTE — Progress Notes (Addendum)
 ANTICOAGULATION CONSULT NOTE  Pharmacy Consult for Heparin  Indication: chest pain/ACS  No Known Allergies  Patient Measurements: Height: 5' 4 (162.6 cm) Weight: 99.8 kg (220 lb) IBW/kg (Calculated) : 54.7 Heparin  Dosing Weight: 77.8 kg  Vital Signs: Temp: 98.6 F (37 C) (10/27 0907) Temp Source: Oral (10/27 0907) BP: 133/74 (10/27 1100) Pulse Rate: 71 (10/27 1100)  Labs: Recent Labs    05/21/24 1305 05/21/24 1451 05/22/24 0434 05/22/24 1000  HGB 12.7  --  11.3*  --   HCT 40.6  --  35.0*  --   PLT 291  --  264  --   HEPARINUNFRC  --   --  0.52 0.36  CREATININE 1.22*  --  1.31*  --   TROPONINIHS 539* 1,019*  --   --     Estimated Creatinine Clearance: 47.8 mL/min (A) (by C-G formula based on SCr of 1.31 mg/dL (H)).   Medical History: Past Medical History:  Diagnosis Date   Arthritis    B12 deficiency    CAD (coronary artery disease)    a. LHC 11/24/2018: mild non-obstructive CAD with 30% stenosis of proximal LAD and 10% stenosis of  proximal to mid RCA   Diabetes mellitus without complication (HCC)    Hyperlipidemia, unspecified hyperlipidemia type    Hypertensive retinopathy, bilateral    Intervertebral disc disorders with radiculopathy, lumbar region    Ocular hypertension, bilateral    Right upper quadrant abdominal pain     Medications:  (Not in a hospital admission)  Scheduled:   [START ON 05/23/2024] aspirin   81 mg Oral Pre-Cath   aspirin  EC  81 mg Oral Daily   atorvastatin   40 mg Oral Daily   carvedilol  6.25 mg Oral BID WC   latanoprost  1 drop Both Eyes QHS   sodium chloride  flush  3 mL Intravenous Q12H   Infusions:   sodium chloride      heparin  950 Units/hr (05/21/24 1849)   PRN: sodium chloride , acetaminophen , nitroGLYCERIN , ondansetron  (ZOFRAN ) IV, sodium chloride  flush  Assessment: 48 yof with a history of CAD, diabetes, HLD. Patient is presenting with chest pain. Heparin  per pharmacy consult placed for chest pain/ACS.   CTA w/out  definite aortic dissection or aneurysm   Patient is not on anticoagulation prior to arrival.  10/27 HL 0434 0.52 - Following 4000 unit bolus and gtt at 950 units/hr - Rate continued 10/27 HL 1000 0.36 - Remains therapeutic but with slight rate decrease   CBC Stable  Goal of Therapy:  Heparin  level 0.3-0.7 units/ml Monitor platelets by anticoagulation protocol: Yes   Plan:  Will slightly increase heparin  infusion to 1000 units/hr Check anti-Xa level at 9pm  Daily heparin  level while on heparin  Continue to monitor H&H and platelets  Dorn Buttner, PharmD, BCPS 05/22/2024 1:46 PM ED Clinical Pharmacist -  [phone]

## 2024-05-22 NOTE — Progress Notes (Signed)
 PHARMACY - ANTICOAGULATION CONSULT NOTE  Pharmacy Consult for heparin  Indication: NSTEMI  Labs: Recent Labs    05/21/24 1305 05/21/24 1451 05/22/24 0434  HGB 12.7  --  11.3*  HCT 40.6  --  35.0*  PLT 291  --  264  HEPARINUNFRC  --   --  0.52  CREATININE 1.22*  --   --   TROPONINIHS 539* 1,019*  --    Assessment/Plan:  68yo female therapeutic on heparin  with initial dosing for NSTEMI. Will continue infusion at current rate of 950 units/hr and confirm stable with additional level.  Margaret Stark, PharmD, BCPS 05/22/2024 5:05 AM

## 2024-05-22 NOTE — ED Notes (Addendum)
 PT given a pillow at this time. PT updated on plan of care.  Breathing is even and unlabored, call light within reach, encouraged to use when needs arise. PT reminded she is NPO. Dr. Barbaraann at bedside.

## 2024-05-23 ENCOUNTER — Inpatient Hospital Stay (HOSPITAL_COMMUNITY)

## 2024-05-23 ENCOUNTER — Telehealth (HOSPITAL_BASED_OUTPATIENT_CLINIC_OR_DEPARTMENT_OTHER): Payer: Self-pay

## 2024-05-23 ENCOUNTER — Encounter (HOSPITAL_COMMUNITY): Payer: Self-pay | Admitting: Internal Medicine

## 2024-05-23 DIAGNOSIS — E785 Hyperlipidemia, unspecified: Secondary | ICD-10-CM | POA: Diagnosis not present

## 2024-05-23 DIAGNOSIS — R7989 Other specified abnormal findings of blood chemistry: Secondary | ICD-10-CM | POA: Diagnosis not present

## 2024-05-23 DIAGNOSIS — N1831 Chronic kidney disease, stage 3a: Secondary | ICD-10-CM | POA: Diagnosis not present

## 2024-05-23 DIAGNOSIS — I214 Non-ST elevation (NSTEMI) myocardial infarction: Secondary | ICD-10-CM | POA: Diagnosis not present

## 2024-05-23 LAB — BASIC METABOLIC PANEL WITH GFR
Anion gap: 10 (ref 5–15)
BUN: 12 mg/dL (ref 8–23)
CO2: 25 mmol/L (ref 22–32)
Calcium: 9.4 mg/dL (ref 8.9–10.3)
Chloride: 103 mmol/L (ref 98–111)
Creatinine, Ser: 1.35 mg/dL — ABNORMAL HIGH (ref 0.44–1.00)
GFR, Estimated: 43 mL/min — ABNORMAL LOW (ref >60)
Glucose, Bld: 89 mg/dL (ref 70–99)
Potassium: 3.9 mmol/L (ref 3.5–5.1)
Sodium: 138 mmol/L (ref 135–145)

## 2024-05-23 LAB — LIPOPROTEIN A (LPA): Lipoprotein (a): 228 nmol/L — ABNORMAL HIGH (ref <75.0)

## 2024-05-23 LAB — C-REACTIVE PROTEIN: CRP: 0.5 mg/dL (ref <1.0)

## 2024-05-23 LAB — GLUCOSE, CAPILLARY: Glucose-Capillary: 123 mg/dL — ABNORMAL HIGH (ref 70–99)

## 2024-05-23 LAB — SEDIMENTATION RATE: Sed Rate: 14 mm/h (ref 0–22)

## 2024-05-23 MED ORDER — CLOPIDOGREL BISULFATE 75 MG PO TABS: 75.0000 mg | ORAL_TABLET | Freq: Every day | ORAL | 6 refills | Status: DC

## 2024-05-23 MED ORDER — CLOPIDOGREL BISULFATE 75 MG PO TABS
75.0000 mg | ORAL_TABLET | Freq: Every day | ORAL | Status: DC
  Administered 2024-05-23: 75 mg via ORAL
  Filled 2024-05-23: qty 1

## 2024-05-23 MED ORDER — NITROGLYCERIN 0.4 MG SL SUBL: 0.4000 mg | SUBLINGUAL_TABLET | SUBLINGUAL | 12 refills | Status: AC | PRN

## 2024-05-23 NOTE — Plan of Care (Signed)
 Patient refused to have her cardiac MRI done because of fear of tight spaces. Would like to talk to doctors early, pt not feeling like the cardiac MRI is that much of a necessity at this time since heart cath was fine.   Problem: Health Behavior/Discharge Planning: Goal: Ability to manage health-related needs will improve Outcome: Progressing   Problem: Education: Goal: Understanding of cardiac disease, CV risk reduction, and recovery process will improve Outcome: Not Progressing

## 2024-05-23 NOTE — Telephone Encounter (Signed)
 Patient contacted by clinical team regarding discharge from hospital on 05/23/24.  Patient understands to follow up with Dr. Barbaraann on 05/29/24 at 8 am at [facility]. Patient understands discharge instructions. Patient understands medications and how to take them. Patient understands to bring all medications to this visit. Patient reports appropriate help at home with ADL's.  Patient had no signs/ symptoms of infection (Temp, redness/ red streaks, swelling, purulent drainage, foul odor or smell) at surgical site.    Patient understands not to place any creams/ lotions/ or antibiotic ointment on any surgical incisions/ wounds without physician approval.

## 2024-05-23 NOTE — Progress Notes (Signed)
  Progress Note   Date: 05/23/2024  Patient Name: Margaret Stark        MRN#: [id number]  Review the patient's clinical findings supports the diagnosis of:   Obesity class II  Signed, Darryle T. Barbaraann, MD, [facility]  [facility]  [address] [place], [place] [address] [phone]  6:46 PM

## 2024-05-23 NOTE — Discharge Summary (Signed)
 Discharge Summary   Patient ID: Margaret Stark MRN: [id number]; DOB: 12-Apr-1956  Admit date: 05/21/2024 Discharge date: 05/23/2024  PCP:  Gretel App, NP    HeartCare Providers Cardiologist:  Darryle ONEIDA Decent, MD      Discharge Diagnoses  Principal Problem:   NSTEMI (non-ST elevated myocardial infarction) [facility]) Active Problems:   HLD (hyperlipidemia)   CKD stage 3a, GFR 45-59 ml/min (HCC)   Diagnostic Studies/Procedures   TTE 05/22/24 IMPRESSIONS     1. Left ventricular ejection fraction, by estimation, is 65 to 70%. The  left ventricle has normal function. The left ventricle has no regional  wall motion abnormalities. Left ventricular diastolic parameters were  normal.   2. Right ventricular systolic function is normal. The right ventricular  size is normal.   3. The mitral valve is normal in structure. Trivial mitral valve  regurgitation.   4. The aortic valve is tricuspid. Aortic valve regurgitation is not  visualized. Aortic valve sclerosis is present, with no evidence of aortic  valve stenosis.   5. The inferior vena cava is normal in size with greater than 50%  respiratory variability, suggesting right atrial pressure of 3 mmHg.   Cardiac Cath 05/22/24 Conclusions: Mild-moderate, nonobstructive coronary artery disease, not significantly changed from prior catheterization in 2020.  Findings are consistent with MI with nonobstructive coronary artery disease (MINOCA). Normal left ventricular systolic function (LVEF greater than 65%) and normal filling pressure (LVEDP 10 mmHg).   Recommendations: Further workup of potential alternative etiologies per primary team.  Anticipate cardiac MRI to assess for evidence of myocarditis. Medical therapy and risk factor modification to prevent progression of mild-moderate CAD.   Lonni Hanson, MD [facility]  _____________   History of Present Illness   Margaret Stark is a 68 y.o. female with  nonobstructive CAD, diabetes, hyperlipidemia who presented to the emergency department for acute chest discomfort, shortness of breath, and diaphoresis.  While in round with EMS the patient received aspirin  324 mg and sublingual nitroglycerin .  High-sensitivity troponins were as elevated as high as 1019.  EKG showed no acute ischemic changes.  CT angiography was negative for an aortic dissection.  Patient was admitted to cardiology service for a non-STEMI.    Hospital Course   Consultants:  Pharmacy for IV heparin    Mild to moderate nonobstructive CAD Hyperlipidemia Possible myocarditis Patient was admitted to cardiology service on 05/21/2024 for suspected NSTEMI as mentioned above.  The patient was started on IV heparin , aspirin  81 mg daily, Coreg 6.25 mg twice daily, and was scheduled for a left heart cath the following day.  A lipid panel was ordered and showed an LDL of 70.  TSH was checked and was normal at 3.02.  LPA was checked and is in process.  On 05/22/2024 the patient denied any ongoing chest pain. An echocardiogram was done yesterday and showed a normal LVEF of 65 to 70%, no RWMA, normal RV systolic function, and grossly normal valve function.  A cardiac cath was done yesterday as planned and showed mild to moderate nonobstructive CAD that was not significantly changed from prior cath in 2020.  IV heparin  was stopped. The patient was recommended to get a cardiac MRI to assess for myocarditis but refused to get the cardiac MRI.  CRP and ESR were ordered and both were within normal limits.  On interview the patient denies any chest pain or shortness of breath.  Discussed radial site care.  The patient was seen by Dr. Decent and  felt to be stable for discharge Continue atorvastatin  40 mg daily. Continue aspirin  81 mg daily. Start clopidogrel 75 mg daily. Stop Coreg 6.25 mg twice daily that was started this hospitalization. Follow-up with Dr. Barbaraann on 05/29/2024 at 8 AM.   Diabetes A1c  was ordered and was 5.7.  The patient was started on sliding scale insulin  but did not need as diabetes is well-controlled.   CKD stage III A Baseline creatinine appears to be about 1.2.  Is up slightly this morning at 1.35      Did the patient have an acute coronary syndrome (MI, NSTEMI, STEMI, etc) this admission?:  No                               Did the patient have a percutaneous coronary intervention (stent / angioplasty)?:  No.        The patient will be scheduled for a TOC follow up appointment  with Dr. Barbaraann on 05/29/2024 at 8 AM.  A message has been sent to the [facility] and Scheduling Pool at the office where the patient should be seen for follow up.  _____________  Discharge Vitals Blood pressure 110/67, pulse 87, temperature 98 F (36.7 C), temperature source Oral, resp. rate 17, height 5' 4 (1.626 m), weight 99.8 kg, SpO2 97%.  Filed Weights   05/21/24 1142  Weight: 99.8 kg   General: Well developed, well nourished, female appearing in no acute distress. Head: Normocephalic, atraumatic.  Neck: Supple without bruits, no JVD. Lungs:  Resp regular and unlabored, CTA. Heart: RRR, or murmur; no rub. Abdomen: Soft, non-tender, non-distended with normoactive bowel sounds.  Extremities: No clubbing or cyanosis, no edema.  Lower extremities are warm. R radial cath site stable without bruising or hematoma.  Patient denies any discomfort.  Radial pulses are 2+ bilaterally Neuro: Alert and oriented X 3.  Psych: Normal affect.   Telemetry: Personally reviewed and showed normal sinus rhythm with resting heart rates in the 70s to 90s.  Labs & Radiologic Studies  CBC Recent Labs    05/21/24 1305 05/22/24 0434  WBC 5.9 5.5  NEUTROABS 3.6  --   HGB 12.7 11.3*  HCT 40.6 35.0*  MCV 89.4 88.8  PLT 291 264   Basic Metabolic Panel Recent Labs    89/72/74 0434 05/23/24 0402  NA 140 138  K 3.9 3.9  CL 108 103  CO2 22 25  GLUCOSE 101* 89  BUN 12 12  CREATININE 1.31*  1.35*  CALCIUM  9.4 9.4   Liver Function Tests Recent Labs    05/21/24 1305  AST 21  ALT 15  ALKPHOS 87  BILITOT 0.7  PROT 7.2  ALBUMIN 3.9   No results for input(s): LIPASE, AMYLASE in the last 72 hours. High Sensitivity Troponin:   Recent Labs  Lab 05/21/24 1305 05/21/24 1451  TROPONINIHS 539* 1,019*    No results for input(s): TRNPT in the last 720 hours.  BNP Invalid input(s): POCBNP No results for input(s): PROBNP in the last 72 hours.  No results for input(s): BNP in the last 72 hours.  D-Dimer No results for input(s): DDIMER in the last 72 hours. Hemoglobin A1C Recent Labs    05/22/24 0434  HGBA1C 5.7*   Fasting Lipid Panel Recent Labs    05/22/24 0434  CHOL 127  HDL 41  LDLCALC 70  TRIG 79  CHOLHDL 3.1   No results found for: LIPOA  Thyroid  Function Tests Recent Labs    05/22/24 0434  TSH 3.020   _____________  CARDIAC CATHETERIZATION Result Date: 05/22/2024 Conclusions: Mild-moderate, nonobstructive coronary artery disease, not significantly changed from prior catheterization in 2020.  Findings are consistent with MI with nonobstructive coronary artery disease (MINOCA). Normal left ventricular systolic function (LVEF greater than 65%) and normal filling pressure (LVEDP 10 mmHg). Recommendations: Further workup of potential alternative etiologies per primary team.  Anticipate cardiac MRI to assess for evidence of myocarditis. Medical therapy and risk factor modification to prevent progression of mild-moderate CAD. Lonni Hanson, MD [facility]  ECHOCARDIOGRAM COMPLETE Result Date: 05/22/2024    ECHOCARDIOGRAM REPORT   Patient Name:   Margaret Stark Date of Exam: 05/22/2024 Medical Rec #:  [id number]         Height:       64.0 in Accession #:    [id number]        Weight:       220.0 lb Date of Birth:  20-Jul-1956        BSA:          2.037 m Patient Age:    67 years          BP:           134/78 mmHg Patient Gender: F                  HR:           77 bpm. Exam Location:  Inpatient Procedure: 2D Echo, Color Doppler and Cardiac Doppler (Both Spectral and Color            Flow Doppler were utilized during procedure). Indications:    NSTEMI I21.4  History:        Patient has no prior history of Echocardiogram examinations.                 CAD, Signs/Symptoms:Fatigue; Risk Factors:Diabetes. H/O                 Hyperlipidemia.  Sonographer:    BERNARDA ROCKS Referring Phys: [id number] DAYNA N DUNN IMPRESSIONS  1. Left ventricular ejection fraction, by estimation, is 65 to 70%. The left ventricle has normal function. The left ventricle has no regional wall motion abnormalities. Left ventricular diastolic parameters were normal.  2. Right ventricular systolic function is normal. The right ventricular size is normal.  3. The mitral valve is normal in structure. Trivial mitral valve regurgitation.  4. The aortic valve is tricuspid. Aortic valve regurgitation is not visualized. Aortic valve sclerosis is present, with no evidence of aortic valve stenosis.  5. The inferior vena cava is normal in size with greater than 50% respiratory variability, suggesting right atrial pressure of 3 mmHg. FINDINGS  Left Ventricle: Left ventricular ejection fraction, by estimation, is 65 to 70%. The left ventricle has normal function. The left ventricle has no regional wall motion abnormalities. The left ventricular internal cavity size was small. There is no left ventricular hypertrophy. Left ventricular diastolic parameters were normal. Right Ventricle: The right ventricular size is normal. Right vetricular wall thickness was not assessed. Right ventricular systolic function is normal. Left Atrium: Left atrial size was normal in size. Right Atrium: Right atrial size was normal in size. Pericardium: There is no evidence of pericardial effusion. Mitral Valve: The mitral valve is normal in structure. Trivial mitral valve regurgitation. MV peak gradient, 5.6 mmHg. The mean mitral  valve gradient is 3.0 mmHg. Tricuspid Valve: The tricuspid valve is  normal in structure. Tricuspid valve regurgitation is trivial. Aortic Valve: The aortic valve is tricuspid. Aortic valve regurgitation is not visualized. Aortic valve sclerosis is present, with no evidence of aortic valve stenosis. Aortic valve mean gradient measures 6.0 mmHg. Aortic valve peak gradient measures 11.0 mmHg. Aortic valve area, by VTI measures 1.99 cm. Pulmonic Valve: The pulmonic valve was not well visualized. Pulmonic valve regurgitation is not visualized. No evidence of pulmonic stenosis. Aorta: The aortic root and ascending aorta are structurally normal, with no evidence of dilitation. Venous: The inferior vena cava is normal in size with greater than 50% respiratory variability, suggesting right atrial pressure of 3 mmHg. IAS/Shunts: No atrial level shunt detected by color flow Doppler.  LEFT VENTRICLE PLAX 2D LVIDd:         3.40 cm     Diastology LVIDs:         2.30 cm     LV e' medial:    8.92 cm/s LV PW:         0.90 cm     LV E/e' medial:  9.0 LV IVS:        1.00 cm     LV e' lateral:   10.60 cm/s LVOT diam:     1.70 cm     LV E/e' lateral: 7.6 LV SV:         65 LV SV Index:   32 LVOT Area:     2.27 cm  LV Volumes (MOD) LV vol d, MOD A2C: 74.2 ml LV vol d, MOD A4C: 94.8 ml LV vol s, MOD A2C: 30.8 ml LV vol s, MOD A4C: 32.1 ml LV SV MOD A2C:     43.4 ml LV SV MOD A4C:     94.8 ml LV SV MOD BP:      52.4 ml RIGHT VENTRICLE             IVC RV Basal diam:  3.30 cm     IVC diam: 1.40 cm RV S prime:     12.20 cm/s TAPSE (M-mode): 2.1 cm LEFT ATRIUM             Index        RIGHT ATRIUM          Index LA diam:        2.70 cm 1.33 cm/m   RA Area:     9.65 cm LA Vol (A2C):   26.1 ml 12.81 ml/m  RA Volume:   17.60 ml 8.64 ml/m LA Vol (A4C):   35.6 ml 17.47 ml/m LA Biplane Vol: 33.0 ml 16.20 ml/m  AORTIC VALVE                     PULMONIC VALVE AV Area (Vmax):    1.97 cm      PV Vmax:       0.88 m/s AV Area (Vmean):   1.85 cm       PV Peak grad:  3.1 mmHg AV Area (VTI):     1.99 cm AV Vmax:           166.00 cm/s AV Vmean:          112.000 cm/s AV VTI:            0.326 m AV Peak Grad:      11.0 mmHg AV Mean Grad:      6.0 mmHg LVOT Vmax:         144.00 cm/s LVOT Vmean:  91.200 cm/s LVOT VTI:          0.286 m LVOT/AV VTI ratio: 0.88  AORTA Ao Root diam: 2.60 cm Ao Asc diam:  2.70 cm MITRAL VALVE MV Area (PHT): 4.68 cm     SHUNTS MV Area VTI:   2.30 cm     Systemic VTI:  0.29 m MV Peak grad:  5.6 mmHg     Systemic Diam: 1.70 cm MV Mean grad:  3.0 mmHg MV Vmax:       1.18 m/s MV Vmean:      78.7 cm/s MV Decel Time: 162 msec MR Peak grad: 62.4 mmHg MR Vmax:      395.00 cm/s MV E velocity: 80.20 cm/s MV A velocity: 101.00 cm/s MV E/A ratio:  0.79 Vina Gull MD Electronically signed by Vina Gull MD Signature Date/Time: 05/22/2024/1:21:47 PM    Final    CT Angio Chest/Abd/Pel for Dissection W and/or Wo Contrast Result Date: 05/21/2024 EXAM: CTA CHEST, ABDOMEN AND PELVIS WITH AND WITHOUT CONTRAST 05/21/2024 05:06:18 PM TECHNIQUE: CTA of the chest was performed with and without the administration of 100 mL of intravenous contrast (iohexol  (OMNIPAQUE ) 350 MG/ML injection 100 mL IOHEXOL  350 MG/ML SOLN). CTA of the abdomen and pelvis was performed with and without the administration of 100 mL of intravenous contrast (iohexol  (OMNIPAQUE ) 350 MG/ML injection 100 mL IOHEXOL  350 MG/ML SOLN). Multiplanar reformatted images are provided for review. MIP images are provided for review. Automated exposure control, iterative reconstruction, and/or weight based adjustment of the mA/kV was utilized to reduce the radiation dose to as low as reasonably achievable. COMPARISON: CT chest abdomen and pelvis 02/11/2024. CLINICAL HISTORY: Acute aortic syndrome (AAS) suspected. FINDINGS: VASCULATURE: AORTA: Motion artifact limits evaluation at the root of the aorta. There are linear hypodense areas of the aortic root which are most likely related to motion  artifact. There is no definite aortic dissection or aneurysm. There are mild calcified atherosclerotic plaques in the thoracic aorta and moderate calcified atherosclerotic plaques throughout the abdominal aorta. PULMONARY ARTERIES: No pulmonary embolism within the limits of this exam. GREAT VESSELS OF AORTIC ARCH: No acute finding. No dissection. No arterial occlusion or significant stenosis. CELIAC TRUNK: No acute finding. No occlusion or significant stenosis. SUPERIOR MESENTERIC ARTERY: No acute finding. No occlusion or significant stenosis. INFERIOR MESENTERIC ARTERY: No acute finding. No occlusion or significant stenosis. RENAL ARTERIES: No acute finding. No occlusion or significant stenosis. ILIAC ARTERIES: No acute finding. No occlusion or significant stenosis. CHEST: MEDIASTINUM: No mediastinal lymphadenopathy. The heart and pericardium demonstrate no acute abnormality. LUNGS AND PLEURA: The lungs are without acute process. No focal consolidation or pulmonary edema. No evidence of pleural effusion or pneumothorax. THORACIC BONES AND SOFT TISSUES: No acute bone or soft tissue abnormality. ABDOMEN AND PELVIS: LIVER: The liver is unremarkable. GALLBLADDER AND BILE DUCTS: Gallbladder is unremarkable. No biliary ductal dilatation. SPLEEN: The spleen is unremarkable. PANCREAS: The pancreas is unremarkable. ADRENAL GLANDS: Bilateral adrenal glands demonstrate no acute abnormality. KIDNEYS, URETERS AND BLADDER: No stones in the kidneys or ureters. No hydronephrosis. No perinephric or periureteral stranding. Urinary bladder is unremarkable. GI AND BOWEL: There is a small hiatal hernia. Stomach and duodenal sweep demonstrate no acute abnormality. Appendix is normal. There is no bowel obstruction. No abnormal bowel wall thickening or distension. REPRODUCTIVE: There are calcified uterine fibroids. There is a posterior fibroid measuring 4.3 cm similar to prior. PERITONEUM AND RETROPERITONEUM: No ascites or free air. LYMPH  NODES: No lymphadenopathy. ABDOMINAL BONES AND SOFT TISSUES:  Degenerative changes at the lower lumbar spine and hips. No acute abnormality of the bones. No acute soft tissue abnormality. IMPRESSION: 1. No definite aortic dissection or aneurysm. Evaluation limited by motion artifact at the aortic root. 2. Moderate calcified atherosclerotic plaques throughout the abdominal aorta and mild calcified atherosclerotic plaques in the thoracic aorta. Consider cardiovascular risk factor optimization and guideline-directed medical therapy. Electronically signed by: Greig Pique MD 05/21/2024 05:42 PM EDT RP Workstation: [id number]   DG Chest 2 View Result Date: 05/21/2024 EXAM: 2 VIEW(S) XRAY OF THE CHEST 05/21/2024 12:59:00 PM COMPARISON: 11/24/18. CLINICAL HISTORY: Chest pain. Chest pain; hx of CAD, diabetes FINDINGS: LUNGS AND PLEURA: No focal pulmonary opacity. No pulmonary edema. No pleural effusion. No pneumothorax. HEART AND MEDIASTINUM: No acute abnormality of the cardiac and mediastinal silhouettes. BONES AND SOFT TISSUES: Diffuse thoracic endplate osteophytes. IMPRESSION: 1. No acute cardiopulmonary process identified. Electronically signed by: Waddell Calk MD 05/21/2024 01:05 PM EDT RP Workstation: [id number]   NM Renal Imaging Flow W/Pharm Result Date: 05/11/2024 CLINICAL DATA:  Hydronephrosis.  History of kidney stones. EXAM: NUCLEAR MEDICINE RENAL SCAN WITH DIURETIC ADMINISTRATION TECHNIQUE: Radionuclide angiographic and sequential renal images were obtained after intravenous injection of radiopharmaceutical. Imaging was continued during slow intravenous injection of Lasix approximately 15 minutes after the start of the examination. RADIOPHARMACEUTICALS:  5.39 mCi Technetium-39m MAG3 IV COMPARISON:  Renal ultrasound 04/13/2024. Abdominopelvic CT 02/11/2024. FINDINGS: Flow: There is decreased perfusion to the left kidney relative to the right. Left renogram: Mildly delayed left renal cortical uptake.  Normal excretion into a mildly dilated left intrarenal collecting system. Normal downsloping renogram curve prior to Lasix administration. Additional washout following Lasix. No evidence of ureteral obstruction. Right renogram: Normal renal cortical uptake and excretion. Differential: Left kidney = 24.2 % Right kidney = 75.8 % T1/2 post Lasix : Left kidney = 24.8 min Right kidney = 35.6 min IMPRESSION: 1. Asymmetric renal function with decreased perfusion and overall function on the left. Split function is approximately 24% left and 76% right. 2. Mild residual chronic left-sided collecting system dilatation without evidence of ureteral obstruction. Electronically Signed   By: Elsie Perone M.D.   On: 05/11/2024 08:29    Disposition Pt is being discharged home today in good condition.  Follow-up Plans & Appointments  Discharge Instructions     AMB referral to Phase II Cardiac Rehabilitation   Complete by: As directed    Diagnosis: Type II MI   After initial evaluation and assessments completed: Virtual Based Care may be provided alone or in conjunction with Phase 2 Cardiac Rehab based on patient barriers.: Yes   Intensive Cardiac Rehabilitation (ICR) [facility] location only OR Traditional Cardiac Rehabilitation (TCR) *If criteria for ICR are not met will enroll in TCR [facility] only): Yes       Discharge Medications Allergies as of 05/23/2024   No Known Allergies      Medication List     TAKE these medications    aspirin  EC 81 MG tablet Take 81 mg by mouth daily. Swallow whole.   atorvastatin  40 MG tablet Commonly known as: LIPITOR Take 1 tablet (40 mg total) by mouth daily.   B-12 COMPLIANCE INJECTION IJ Inject 1 Dose as directed every 30 (thirty) days.   BD Pen Needle Micro U/F 32G X 6 MM Misc Generic drug: Insulin  Pen Needle USE ONCE DAILY WITH SAXENDA    clopidogrel 75 MG tablet Commonly known as: PLAVIX Take 1 tablet (75 mg total) by mouth daily.   latanoprost 0.005 %  ophthalmic solution Commonly known as: XALATAN Place 1 drop into both eyes at bedtime.   nitroGLYCERIN  0.4 MG SL tablet Commonly known as: NITROSTAT  Place 1 tablet (0.4 mg total) under the tongue every 5 (five) minutes x 3 doses as needed for chest pain (call 911 if still has chest pain after 3 doses).   Semaglutide  (2 MG/DOSE) 8 MG/3ML Sopn Inject 2 mg as directed once a week.   VITAMIN D  PO Take 1 tablet by mouth daily.         Outstanding Labs/Studies   Duration of Discharge Encounter: APP Time: 20 minutes   Signed, Morse Clause, PA-C 05/23/2024, 9:18 AM

## 2024-05-23 NOTE — Discharge Instructions (Addendum)
 Radial Site Care Refer to this sheet in the next few weeks. These instructions provide you with information on caring for yourself after your procedure. Your caregiver may also give you more specific instructions. Your treatment has been planned according to current medical practices, but problems sometimes occur. Call your caregiver if you have any problems or questions after your procedure. HOME CARE INSTRUCTIONS You may shower the day after the procedure. Remove the bandage (dressing) and gently wash the site with plain soap and water. Gently pat the site dry.  Do not apply powder or lotion to the site.  Do not submerge the affected site in water for 3 to 5 days.  Inspect the site at least twice daily.  Do not flex or bend the affected arm for 24 hours.  No lifting over 5 pounds (2.3 kg) for 5 days after your procedure.  Do not drive home if you are discharged the same day of the procedure. Have someone else drive you.  You may drive 24 hours after the procedure unless otherwise instructed by your caregiver.  What to expect: Any bruising will usually fade within 1 to 2 weeks.  Blood that collects in the tissue (hematoma) may be painful to the touch. It should usually decrease in size and tenderness within 1 to 2 weeks.  SEEK IMMEDIATE MEDICAL CARE IF: You have unusual pain at the radial site.  You have redness, warmth, swelling, or pain at the radial site.  You have drainage (other than a small amount of blood on the dressing).  You have chills.  You have a fever or persistent symptoms for more than 72 hours.  You have a fever and your symptoms suddenly get worse.  Your arm becomes pale, cool, tingly, or numb.  You have heavy bleeding from the site. Hold pressure on the site.    Information about your medication: Plavix (anti-platelet agent)  Generic Name (Brand): clopidogrel (Plavix), once daily medication  PURPOSE: You are taking this medication along with aspirin  to lower your  chance of having a heart attack, stroke, or blood clots in your heart stent. These can be fatal. Plavix and aspirin  help prevent platelets from sticking together and forming a clot that can block an artery or your stent.   Common SIDE EFFECTS you may experience include: bruising or bleeding more easily, shortness of breath  Do not stop taking PLAVIX without talking to the doctor who prescribes it for you. People who are treated with a stent and stop taking Plavix too soon, have a higher risk of getting a blood clot in the stent, having a heart attack, or dying. If you stop Plavix because of bleeding, or for other reasons, your risk of a heart attack or stroke may increase.   Avoid taking NSAID agents or anti-inflammatory medications such as ibuprofen, naproxen given increased bleed risk with plavix - can use acetaminophen  (Tylenol ) if needed for pain.  Avoid taking over the counter stomach medications omeprazole (Prilosec) or esomeprazole (Nexium) since these do interact and make plavix less effective - ask your pharmacist or doctor for alterative agents if needed for heartburn or GERD.   Tell all of your doctors and dentists that you are taking Plavix. They should talk to the doctor who prescribed Plavix for you before you have any surgery or invasive procedure.   Contact your health care provider if you experience: severe or uncontrollable bleeding, pink/red/brown urine, vomiting blood or vomit that looks like coffee grounds, red or black stools (looks  like tar), coughing up blood or blood clots ----------------------------------------------------------------------------------------------------------------------

## 2024-05-23 NOTE — Progress Notes (Signed)
 CARDIAC REHAB PHASE I   PRE:  Rate/Rhythm: 95 NSR  BP:  Sitting: 110/67      SpO2: 100 RA  MODE:  Ambulation: 470 ft    POST:  Rate/Rhythm: 106 ST  BP:  Sitting: 127/82      SpO2: 100 RA  Pt ambulated with supervision assistance in the hallway, pt deneis feeling anything usual.  Dicussed NSTEMI, risk factors, exercise guidelines, HH and diabetic diet, and CRP2. [facility].    Margaret FORBES Candy  MS, ACSM-CEP 9:51 AM 05/23/2024    Service time is from 0920 to 0951.

## 2024-05-23 NOTE — Progress Notes (Signed)
 DISCHARGE NOTE HOME Margaret Stark to be discharged Home per MD order. Discussed prescriptions and follow up appointments with the patient. Prescriptions given to patient; medication list explained in detail. Patient verbalized understanding.  Skin clean, dry and intact without evidence of skin break down, no evidence of skin tears noted. IV catheter discontinued intact. Site without signs and symptoms of complications. Dressing and pressure applied. Pt denies pain at the site currently. No complaints noted.  Patient free of lines, drains, and wounds.   An After Visit Summary (AVS) was printed and given to the patient. Patient escorted via wheelchair, and discharged home via private auto.  Peyton SHAUNNA Pepper, RN

## 2024-05-25 ENCOUNTER — Telehealth (HOSPITAL_COMMUNITY): Payer: Self-pay

## 2024-05-25 NOTE — Telephone Encounter (Signed)
 Phase II referral. Attempted to call patient regarding interest in cardiac rehab- no answer, left message. Sent MyChart message.  F/u 11/03.

## 2024-05-26 ENCOUNTER — Telehealth (HOSPITAL_COMMUNITY): Payer: Self-pay

## 2024-05-26 NOTE — Telephone Encounter (Signed)
 Pt insurance is active and benefits verified through [facility]. Co-pay $20, DED $0/$0 met, out of pocket $4,000/$615 met, co-insurance 0%. No pre-authorization required. Passport, 05/26/2024 @ 9:39am, REF# [id number].  TCR/ICR? ICR Visit(date of service)limitation? No Can multiple codes be used on the same date of service/visit?(IF ITS A LIMIT) N/A  Is this a lifetime maximum or an annual maximum? Annual Has the member used any of these services to date? No Is there a time limit (weeks/months) on start of program and/or program completion? No

## 2024-05-28 NOTE — Progress Notes (Unsigned)
 Cardiology Office Note:  .   Date:  05/29/2024  ID:  Margaret Stark, DOB Sep 19, 1955, MRN [id number] PCP: Gretel App, NP  [facility] Providers Cardiologist:  Darryle ONEIDA Decent, MD    History of Present Illness: .    Chief Complaint  Patient presents with   Follow-up    Margaret Stark is a 68 y.o. female with history of CAD who presents for follow-up.    History of Present Illness   Margaret Stark is a 68 year old female with diabetes, CKD 3A, and nonobstructive CAD who presents for follow-up after a non-STEMI.  She was admitted to the hospital last week for a non-STEMI. During her hospitalization, she was found to have no obstructive coronary artery disease. She was unable to undergo a cardiac MRI due to claustrophobia.  She feels better since her discharge from the hospital with no further chest pressure or symptoms of angina. She experiences some anxiety, which she attributes to the anticipation of today's visit, but otherwise feels fine. She has resumed physical activity, including walking on Thursday and Friday, with no restrictions in her activities.  She is also on Lipitor 40 mg, with her most recent LDL recorded at 70.          Problem List Non-obstructive CAD -NSTEMI 10/2018 & 04/2024 -> non-obstructive CAD -cannot tolerate CMR 2, HLD -T chol 127, HDL 41, LDL 70, TG 79 3. DM -A1c 5.7 4. CKD 3a    ROS: All other ROS reviewed and negative. Pertinent positives noted in the HPI.     Studies Reviewed: SABRA       TTE 05/22/2024  1. Left ventricular ejection fraction, by estimation, is 65 to 70%. The  left ventricle has normal function. The left ventricle has no regional  wall motion abnormalities. Left ventricular diastolic parameters were  normal.   2. Right ventricular systolic function is normal. The right ventricular  size is normal.   3. The mitral valve is normal in structure. Trivial mitral valve  regurgitation.   4. The aortic valve is  tricuspid. Aortic valve regurgitation is not  visualized. Aortic valve sclerosis is present, with no evidence of aortic  valve stenosis.   5. The inferior vena cava is normal in size with greater than 50%  respiratory variability, suggesting right atrial pressure of 3 mmHg.  Physical Exam:   VS:  BP 112/60   Pulse 80   Ht 5' 4 (1.626 m)   Wt 220 lb (99.8 kg)   BMI 37.76 kg/m    Wt Readings from Last 3 Encounters:  05/29/24 220 lb (99.8 kg)  05/21/24 220 lb (99.8 kg)  04/25/24 226 lb 6.4 oz (102.7 kg)    GEN: Well nourished, well developed in no acute distress NECK: No JVD; No carotid bruits CARDIAC: RRR, no murmurs, rubs, gallops RESPIRATORY:  Clear to auscultation without rales, wheezing or rhonchi  ABDOMEN: Soft, non-tender, non-distended EXTREMITIES:  No edema; No deformity  ASSESSMENT AND PLAN: .   Assessment and Plan    Recent non-ST elevation myocardial infarction without obstructive coronary artery disease Recent NSTEMI without obstructive CAD, similar to 2020 event. No blockages. Cardiac MRI not feasible. No angina or activity restrictions. - Continue aspirin  and Plavix for one year. - Discontinue Plavix after one year, continue aspirin . - No antihypertensives or beta blockers needed. - Encourage unrestricted exercise.  Nonobstructive coronary artery disease Nonobstructive CAD confirmed. Cholesterol stable. - Continue Lipitor 40 mg.  Mixed hyperlipidemia Mixed hyperlipidemia  well-managed, LDL at 70. - Continue Lipitor 40 mg.  Chronic kidney disease stage 3A Chronic kidney disease stage 3A. No management changes.          Follow-up: Return in about 6 months (around 11/26/2024).  Signed, Darryle DASEN. Barbaraann, MD, [facility]  [facility]  [address] [place], [place] [address] [phone]  8:10 AM

## 2024-05-29 ENCOUNTER — Encounter: Payer: Self-pay | Admitting: Cardiovascular Disease

## 2024-05-29 ENCOUNTER — Ambulatory Visit: Attending: Cardiovascular Disease | Admitting: Cardiovascular Disease

## 2024-05-29 ENCOUNTER — Ambulatory Visit: Admitting: Cardiovascular Disease

## 2024-05-29 VITALS — BP 112/60 | HR 80 | Ht 64.0 in | Wt 220.0 lb

## 2024-05-29 DIAGNOSIS — I2510 Atherosclerotic heart disease of native coronary artery without angina pectoris: Secondary | ICD-10-CM

## 2024-05-29 DIAGNOSIS — I150 Renovascular hypertension: Secondary | ICD-10-CM | POA: Diagnosis not present

## 2024-05-29 DIAGNOSIS — E782 Mixed hyperlipidemia: Secondary | ICD-10-CM | POA: Diagnosis not present

## 2024-05-29 MED ORDER — CLOPIDOGREL BISULFATE 75 MG PO TABS: 75.0000 mg | ORAL_TABLET | Freq: Every day | ORAL | 3 refills | Status: AC

## 2024-05-29 NOTE — Patient Instructions (Signed)
   Follow-Up: At [facility], you and your health needs are our priority.  As part of our continuing mission to provide you with exceptional heart care, our providers are all part of one team.  This team includes your primary Cardiologist (physician) and Advanced Practice Providers or APPs (Physician Assistants and Nurse Practitioners) who all work together to provide you with the care you need, when you need it.  Your next appointment:   6 month(s)  Provider:   Any APP      Then, Margaret ONEIDA Decent, MD will plan to see you again in 12 month(s).

## 2024-05-30 ENCOUNTER — Encounter (HOSPITAL_COMMUNITY): Payer: Self-pay

## 2024-05-30 ENCOUNTER — Telehealth (HOSPITAL_COMMUNITY): Payer: Self-pay

## 2024-05-30 NOTE — Telephone Encounter (Signed)
 Attempted to call regarding scheduling cardiac rehab- no answer, left message. Previous MyChart message has not been read, mailed letter.

## 2024-06-12 ENCOUNTER — Ambulatory Visit (INDEPENDENT_AMBULATORY_CARE_PROVIDER_SITE_OTHER)

## 2024-06-12 DIAGNOSIS — E538 Deficiency of other specified B group vitamins: Secondary | ICD-10-CM

## 2024-06-12 MED ORDER — CYANOCOBALAMIN 1000 MCG/ML IJ SOLN
1000.0000 ug | Freq: Once | INTRAMUSCULAR | Status: AC
  Administered 2024-06-12: 1000 ug via INTRAMUSCULAR

## 2024-06-12 NOTE — Progress Notes (Signed)
 Patient was administered a B12 injection into her right deltoid. Patient tolerated the B12 injection well.

## 2024-06-14 ENCOUNTER — Telehealth (HOSPITAL_COMMUNITY): Payer: Self-pay

## 2024-06-14 NOTE — Telephone Encounter (Signed)
 Attempted f/u call to schedule cardiac rehab- no answer, left message.  Closing referral.

## 2024-06-15 ENCOUNTER — Encounter: Payer: Self-pay | Admitting: Pharmacist

## 2024-06-15 NOTE — Progress Notes (Signed)
 Pharmacy Quality Measure Review  This patient is appearing on a report for being at risk of failing the adherence measure for cholesterol (statin) medications this calendar year.   Medication: atorvastatin  40 mg Last fill date: 10/29/23 for 90 day supply  Insurance report was not up to date. No action needed at this time.  Medication has been refilled as of 05/05/24 x90ds.  Next refill due 2026.

## 2024-07-14 ENCOUNTER — Ambulatory Visit

## 2024-07-14 DIAGNOSIS — E538 Deficiency of other specified B group vitamins: Secondary | ICD-10-CM | POA: Diagnosis not present

## 2024-07-14 MED ORDER — CYANOCOBALAMIN 1000 MCG/ML IJ SOLN
1000.0000 ug | Freq: Once | INTRAMUSCULAR | Status: AC
  Administered 2024-07-14: 1000 ug via INTRAMUSCULAR

## 2024-07-14 NOTE — Progress Notes (Signed)
 After obtaining consent, and per orders of Leron Glance, NP injection of B-12 given IM in L Deltoid by Doyce Croak, CMA. Patient tolerated injection well.

## 2024-08-14 ENCOUNTER — Ambulatory Visit

## 2024-09-15 ENCOUNTER — Ambulatory Visit: Admitting: Physician Assistant

## 2024-10-26 ENCOUNTER — Ambulatory Visit: Admitting: Nurse Practitioner

## 2025-02-06 ENCOUNTER — Ambulatory Visit
